# Patient Record
Sex: Female | Born: 1935 | Race: White | Hispanic: No
Health system: Southern US, Community
[De-identification: ages and names within clinical notes are randomized; demographics above are authoritative.]

---

## 2001-11-24 ENCOUNTER — Encounter: Admission: RE | Admit: 2001-11-24 | Discharge: 2001-11-24 | Payer: Self-pay | Admitting: *Deleted

## 2001-11-24 ENCOUNTER — Encounter: Payer: Self-pay | Admitting: *Deleted

## 2001-12-15 ENCOUNTER — Ambulatory Visit (HOSPITAL_COMMUNITY): Admission: RE | Admit: 2001-12-15 | Discharge: 2001-12-15 | Payer: Self-pay | Admitting: Neurology

## 2001-12-15 ENCOUNTER — Encounter: Payer: Self-pay | Admitting: Neurology

## 2003-10-16 ENCOUNTER — Other Ambulatory Visit: Payer: Self-pay

## 2004-07-29 ENCOUNTER — Ambulatory Visit: Payer: Self-pay | Admitting: Family Medicine

## 2005-04-11 ENCOUNTER — Emergency Department: Payer: Self-pay | Admitting: Unknown Physician Specialty

## 2005-04-30 ENCOUNTER — Other Ambulatory Visit: Payer: Self-pay

## 2005-04-30 ENCOUNTER — Emergency Department: Payer: Self-pay | Admitting: Emergency Medicine

## 2005-05-12 ENCOUNTER — Ambulatory Visit: Payer: Self-pay | Admitting: Family Medicine

## 2005-09-07 ENCOUNTER — Ambulatory Visit: Payer: Self-pay | Admitting: Family Medicine

## 2005-09-11 ENCOUNTER — Other Ambulatory Visit: Payer: Self-pay

## 2005-09-11 ENCOUNTER — Emergency Department: Payer: Self-pay | Admitting: Emergency Medicine

## 2005-09-30 ENCOUNTER — Ambulatory Visit: Payer: Self-pay | Admitting: Family Medicine

## 2005-11-17 ENCOUNTER — Ambulatory Visit: Payer: Self-pay | Admitting: Internal Medicine

## 2006-04-06 ENCOUNTER — Ambulatory Visit: Payer: Self-pay | Admitting: Family Medicine

## 2006-09-23 ENCOUNTER — Other Ambulatory Visit: Payer: Self-pay

## 2006-09-24 ENCOUNTER — Inpatient Hospital Stay: Payer: Self-pay | Admitting: Internal Medicine

## 2006-10-20 ENCOUNTER — Other Ambulatory Visit: Payer: Self-pay

## 2006-10-20 ENCOUNTER — Inpatient Hospital Stay: Payer: Self-pay | Admitting: *Deleted

## 2006-10-22 ENCOUNTER — Ambulatory Visit: Payer: Self-pay | Admitting: Internal Medicine

## 2006-11-22 ENCOUNTER — Ambulatory Visit: Payer: Self-pay | Admitting: Internal Medicine

## 2007-06-18 ENCOUNTER — Observation Stay: Payer: Self-pay | Admitting: Internal Medicine

## 2007-06-18 ENCOUNTER — Other Ambulatory Visit: Payer: Self-pay

## 2008-07-09 ENCOUNTER — Inpatient Hospital Stay: Payer: Self-pay | Admitting: Specialist

## 2008-10-01 ENCOUNTER — Inpatient Hospital Stay: Payer: Self-pay | Admitting: Internal Medicine

## 2009-11-20 ENCOUNTER — Ambulatory Visit: Payer: Self-pay | Admitting: Internal Medicine

## 2010-05-16 ENCOUNTER — Inpatient Hospital Stay: Payer: Self-pay | Admitting: Internal Medicine

## 2010-08-22 ENCOUNTER — Inpatient Hospital Stay: Payer: Self-pay | Admitting: Internal Medicine

## 2011-07-24 ENCOUNTER — Ambulatory Visit: Payer: Self-pay | Admitting: Internal Medicine

## 2011-07-27 ENCOUNTER — Inpatient Hospital Stay: Payer: Self-pay | Admitting: Internal Medicine

## 2011-08-24 ENCOUNTER — Ambulatory Visit: Payer: Self-pay | Admitting: Internal Medicine

## 2012-02-16 ENCOUNTER — Inpatient Hospital Stay: Payer: Self-pay | Admitting: Internal Medicine

## 2012-02-16 LAB — COMPREHENSIVE METABOLIC PANEL
Albumin: 3.3 g/dL — ABNORMAL LOW (ref 3.4–5.0)
Anion Gap: 8 (ref 7–16)
Bilirubin,Total: 0.8 mg/dL (ref 0.2–1.0)
Calcium, Total: 8.8 mg/dL (ref 8.5–10.1)
Chloride: 109 mmol/L — ABNORMAL HIGH (ref 98–107)
Co2: 26 mmol/L (ref 21–32)
Creatinine: 1.2 mg/dL (ref 0.60–1.30)
EGFR (African American): 48 — ABNORMAL LOW
EGFR (Non-African Amer.): 41 — ABNORMAL LOW
Glucose: 147 mg/dL — ABNORMAL HIGH (ref 65–99)
Osmolality: 290 (ref 275–301)
Potassium: 3.6 mmol/L (ref 3.5–5.1)
SGOT(AST): 37 U/L (ref 15–37)
SGPT (ALT): 20 U/L
Sodium: 143 mmol/L (ref 136–145)

## 2012-02-16 LAB — TSH: Thyroid Stimulating Horm: 0.803 u[IU]/mL

## 2012-02-16 LAB — CK TOTAL AND CKMB (NOT AT ARMC)
CK, Total: 80 U/L (ref 21–215)
CK, Total: 115 U/L (ref 21–215)
CK-MB: 0.6 ng/mL (ref 0.5–3.6)
CK-MB: 0.6 ng/mL (ref 0.5–3.6)
CK-MB: 0.9 ng/mL (ref 0.5–3.6)

## 2012-02-16 LAB — URINALYSIS, COMPLETE
Bilirubin,UR: NEGATIVE
Ketone: NEGATIVE
Leukocyte Esterase: NEGATIVE
Nitrite: NEGATIVE
Squamous Epithelial: NONE SEEN
WBC UR: 1 /HPF (ref 0–5)

## 2012-02-16 LAB — CBC
HCT: 26.4 % — ABNORMAL LOW (ref 35.0–47.0)
MCH: 27.1 pg (ref 26.0–34.0)
MCV: 84 fL (ref 80–100)
Platelet: 77 10*3/uL — ABNORMAL LOW (ref 150–440)
RBC: 3.15 10*6/uL — ABNORMAL LOW (ref 3.80–5.20)
RDW: 16.5 % — ABNORMAL HIGH (ref 11.5–14.5)

## 2012-02-16 LAB — FOLATE: Folic Acid: 6.1 ng/mL (ref 3.1–100.0)

## 2012-02-16 LAB — PROTIME-INR: Prothrombin Time: 15.6 secs — ABNORMAL HIGH (ref 11.5–14.7)

## 2012-02-16 LAB — TROPONIN I: Troponin-I: 0.14 ng/mL — ABNORMAL HIGH

## 2012-02-16 LAB — PRO B NATRIURETIC PEPTIDE: B-Type Natriuretic Peptide: 1403 pg/mL — ABNORMAL HIGH (ref 0–450)

## 2012-02-17 LAB — BASIC METABOLIC PANEL
Anion Gap: 7 (ref 7–16)
BUN: 25 mg/dL — ABNORMAL HIGH (ref 7–18)
Calcium, Total: 8.2 mg/dL — ABNORMAL LOW (ref 8.5–10.1)
Chloride: 106 mmol/L (ref 98–107)
Co2: 25 mmol/L (ref 21–32)
Creatinine: 1.22 mg/dL (ref 0.60–1.30)
EGFR (African American): 47 — ABNORMAL LOW
EGFR (Non-African Amer.): 41 — ABNORMAL LOW
Glucose: 97 mg/dL (ref 65–99)
Osmolality: 280 (ref 275–301)
Sodium: 138 mmol/L (ref 136–145)

## 2012-02-17 LAB — CBC WITH DIFFERENTIAL/PLATELET
Basophil #: 0 10*3/uL (ref 0.0–0.1)
Basophil %: 0.1 %
Eosinophil #: 0 10*3/uL (ref 0.0–0.7)
HGB: 7.3 g/dL — ABNORMAL LOW (ref 12.0–16.0)
Lymphocyte #: 0.6 10*3/uL — ABNORMAL LOW (ref 1.0–3.6)
Monocyte #: 0.5 x10 3/mm (ref 0.2–0.9)
Neutrophil %: 84.4 %
RBC: 2.69 10*6/uL — ABNORMAL LOW (ref 3.80–5.20)
RDW: 16.6 % — ABNORMAL HIGH (ref 11.5–14.5)
WBC: 7 10*3/uL (ref 3.6–11.0)

## 2012-02-17 LAB — MAGNESIUM: Magnesium: 1.9 mg/dL

## 2012-02-17 LAB — RETICULOCYTES: Absolute Retic Count: 0.03 10*6/uL (ref 0.024–0.084)

## 2012-02-18 LAB — BASIC METABOLIC PANEL
BUN: 24 mg/dL — ABNORMAL HIGH (ref 7–18)
Calcium, Total: 7.9 mg/dL — ABNORMAL LOW (ref 8.5–10.1)
Co2: 24 mmol/L (ref 21–32)
EGFR (African American): >60
EGFR (Non-African Amer.): 54 — ABNORMAL LOW
Glucose: 94 mg/dL (ref 65–99)
Potassium: 3.7 mmol/L (ref 3.5–5.1)
Sodium: 140 mmol/L (ref 136–145)

## 2012-02-18 LAB — CBC WITH DIFFERENTIAL/PLATELET
Basophil #: 0 10*3/uL (ref 0.0–0.1)
Basophil %: 0.4 %
Eosinophil %: 3.8 %
HCT: 24.2 % — ABNORMAL LOW (ref 35.0–47.0)
HGB: 7.9 g/dL — ABNORMAL LOW (ref 12.0–16.0)
MCHC: 32.7 g/dL (ref 32.0–36.0)
MCV: 82 fL (ref 80–100)
Monocyte #: 0.4 x10 3/mm (ref 0.2–0.9)
Neutrophil #: 4.2 10*3/uL (ref 1.4–6.5)
Neutrophil %: 77.3 %
RBC: 2.94 10*6/uL — ABNORMAL LOW (ref 3.80–5.20)
WBC: 5.5 10*3/uL (ref 3.6–11.0)

## 2012-02-19 LAB — CBC WITH DIFFERENTIAL/PLATELET
Basophil #: 0.1 10*3/uL (ref 0.0–0.1)
Basophil %: 1.5 %
Eosinophil #: 0.3 10*3/uL (ref 0.0–0.7)
MCH: 26.7 pg (ref 26.0–34.0)
Monocyte #: 0.4 x10 3/mm (ref 0.2–0.9)
Monocyte %: 9 %
Neutrophil #: 3.1 10*3/uL (ref 1.4–6.5)
Neutrophil %: 64.8 %
Platelet: 81 10*3/uL — ABNORMAL LOW (ref 150–440)
RBC: 2.89 10*6/uL — ABNORMAL LOW (ref 3.80–5.20)
RDW: 16.4 % — ABNORMAL HIGH (ref 11.5–14.5)

## 2012-02-19 LAB — BASIC METABOLIC PANEL
Anion Gap: 9 (ref 7–16)
BUN: 18 mg/dL (ref 7–18)
Calcium, Total: 7.9 mg/dL — ABNORMAL LOW (ref 8.5–10.1)
Chloride: 107 mmol/L (ref 98–107)
Co2: 26 mmol/L (ref 21–32)
EGFR (African American): >60
EGFR (Non-African Amer.): 56 — ABNORMAL LOW
Osmolality: 284 (ref 275–301)
Sodium: 142 mmol/L (ref 136–145)

## 2012-02-20 LAB — BASIC METABOLIC PANEL
Anion Gap: 5 — ABNORMAL LOW (ref 7–16)
BUN: 19 mg/dL — ABNORMAL HIGH (ref 7–18)
Creatinine: 1.16 mg/dL (ref 0.60–1.30)
EGFR (African American): 50 — ABNORMAL LOW
EGFR (Non-African Amer.): 43 — ABNORMAL LOW
Glucose: 152 mg/dL — ABNORMAL HIGH (ref 65–99)
Osmolality: 292 (ref 275–301)
Potassium: 4.1 mmol/L (ref 3.5–5.1)
Sodium: 144 mmol/L (ref 136–145)

## 2012-02-21 ENCOUNTER — Ambulatory Visit: Payer: Self-pay | Admitting: Internal Medicine

## 2012-03-04 ENCOUNTER — Inpatient Hospital Stay: Payer: Self-pay | Admitting: Internal Medicine

## 2012-03-04 LAB — URINALYSIS, COMPLETE
Bacteria: NONE SEEN
Bilirubin,UR: NEGATIVE
Glucose,UR: NEGATIVE mg/dL (ref 0–75)
Ketone: NEGATIVE
Leukocyte Esterase: NEGATIVE
Nitrite: NEGATIVE
Ph: 7 (ref 4.5–8.0)
Protein: NEGATIVE
RBC,UR: 22 /HPF (ref 0–5)
Specific Gravity: 1.006 (ref 1.003–1.030)
Squamous Epithelial: NONE SEEN
WBC UR: 1 /HPF (ref 0–5)

## 2012-03-04 LAB — APTT: Activated PTT: 29.6 secs (ref 23.6–35.9)

## 2012-03-04 LAB — CBC WITH DIFFERENTIAL/PLATELET
Basophil #: 0 10*3/uL (ref 0.0–0.1)
Basophil #: 0 10*3/uL (ref 0.0–0.1)
Basophil %: 0.9 %
Basophil %: 0.8 %
Eosinophil #: 0 10*3/uL (ref 0.0–0.7)
Eosinophil #: 0 10*3/uL (ref 0.0–0.7)
Eosinophil %: 0.3 %
Eosinophil %: 0.7 %
HCT: 26.2 % — ABNORMAL LOW (ref 35.0–47.0)
HCT: 24 % — ABNORMAL LOW (ref 35.0–47.0)
HGB: 8.6 g/dL — ABNORMAL LOW (ref 12.0–16.0)
HGB: 7.8 g/dL — ABNORMAL LOW (ref 12.0–16.0)
Lymphocyte #: 0.6 10*3/uL — ABNORMAL LOW (ref 1.0–3.6)
Lymphocyte #: 0.8 10*3/uL — ABNORMAL LOW (ref 1.0–3.6)
Lymphocyte %: 11.6 %
Lymphocyte %: 17.7 %
MCH: 27.2 pg (ref 26.0–34.0)
MCH: 26.7 pg (ref 26.0–34.0)
MCHC: 32.8 g/dL (ref 32.0–36.0)
MCHC: 32.5 g/dL (ref 32.0–36.0)
MCV: 83 fL (ref 80–100)
MCV: 82 fL (ref 80–100)
Monocyte #: 0.3 x10 3/mm (ref 0.2–0.9)
Monocyte #: 0.4 x10 3/mm (ref 0.2–0.9)
Monocyte %: 5.5 %
Monocyte %: 9.5 %
Neutrophil #: 4.3 10*3/uL (ref 1.4–6.5)
Neutrophil #: 3.3 10*3/uL (ref 1.4–6.5)
Neutrophil %: 81.7 %
Neutrophil %: 71.3 %
Platelet: 174 10*3/uL (ref 150–440)
Platelet: 132 10*3/uL — ABNORMAL LOW (ref 150–440)
RBC: 3.17 10*6/uL — ABNORMAL LOW (ref 3.80–5.20)
RBC: 2.91 10*6/uL — ABNORMAL LOW (ref 3.80–5.20)
RDW: 18.4 % — ABNORMAL HIGH (ref 11.5–14.5)
RDW: 18.5 % — ABNORMAL HIGH (ref 11.5–14.5)
WBC: 5.2 10*3/uL (ref 3.6–11.0)
WBC: 4.6 10*3/uL (ref 3.6–11.0)

## 2012-03-04 LAB — COMPREHENSIVE METABOLIC PANEL
Albumin: 3.2 g/dL — ABNORMAL LOW (ref 3.4–5.0)
Alkaline Phosphatase: 80 U/L (ref 50–136)
Anion Gap: 8 (ref 7–16)
BUN: 25 mg/dL — ABNORMAL HIGH (ref 7–18)
Bilirubin,Total: 0.9 mg/dL (ref 0.2–1.0)
Calcium, Total: 8.7 mg/dL (ref 8.5–10.1)
Chloride: 105 mmol/L (ref 98–107)
Co2: 30 mmol/L (ref 21–32)
Creatinine: 1.16 mg/dL (ref 0.60–1.30)
EGFR (African American): 50 — ABNORMAL LOW
EGFR (Non-African Amer.): 43 — ABNORMAL LOW
Glucose: 128 mg/dL — ABNORMAL HIGH (ref 65–99)
Osmolality: 291 (ref 275–301)
Potassium: 4.5 mmol/L (ref 3.5–5.1)
SGOT(AST): 54 U/L — ABNORMAL HIGH (ref 15–37)
SGPT (ALT): 25 U/L
Sodium: 143 mmol/L (ref 136–145)
Total Protein: 7.2 g/dL (ref 6.4–8.2)

## 2012-03-04 LAB — PROTIME-INR
INR: 1
Prothrombin Time: 13.4 secs (ref 11.5–14.7)

## 2012-03-04 LAB — PRO B NATRIURETIC PEPTIDE: B-Type Natriuretic Peptide: 1077 pg/mL — ABNORMAL HIGH (ref 0–450)

## 2012-03-04 LAB — LIPASE, BLOOD: Lipase: 113 U/L (ref 73–393)

## 2012-03-04 LAB — TROPONIN I: Troponin-I: <0.02 ng/mL

## 2012-03-05 LAB — CBC WITH DIFFERENTIAL/PLATELET
Basophil #: 0 10*3/uL (ref 0.0–0.1)
Basophil %: 0.4 %
Eosinophil #: 0.1 10*3/uL (ref 0.0–0.7)
Eosinophil %: 2.1 %
HCT: 19.8 % — ABNORMAL LOW (ref 35.0–47.0)
HGB: 6.5 g/dL — ABNORMAL LOW (ref 12.0–16.0)
Lymphocyte %: 19.5 %
MCH: 27.7 pg (ref 26.0–34.0)
Monocyte #: 0.4 x10 3/mm (ref 0.2–0.9)
Neutrophil %: 66.4 %
Platelet: 92 10*3/uL — ABNORMAL LOW (ref 150–440)

## 2012-03-05 LAB — BASIC METABOLIC PANEL
Anion Gap: 10 (ref 7–16)
Calcium, Total: 6.7 mg/dL — CL (ref 8.5–10.1)
Chloride: 115 mmol/L — ABNORMAL HIGH (ref 98–107)
Co2: 24 mmol/L (ref 21–32)
Creatinine: 0.94 mg/dL (ref 0.60–1.30)
Glucose: 72 mg/dL (ref 65–99)
Potassium: 2.9 mmol/L — ABNORMAL LOW (ref 3.5–5.1)
Sodium: 149 mmol/L — ABNORMAL HIGH (ref 136–145)

## 2012-03-05 LAB — HEMOGLOBIN: HGB: 9.7 g/dL — ABNORMAL LOW (ref 12.0–16.0)

## 2012-03-06 LAB — CBC WITH DIFFERENTIAL/PLATELET
Basophil %: 0.6 %
Eosinophil %: 3.1 %
HCT: 27.2 % — ABNORMAL LOW (ref 35.0–47.0)
Lymphocyte #: 0.7 10*3/uL — ABNORMAL LOW (ref 1.0–3.6)
Lymphocyte %: 16.5 %
MCH: 27.7 pg (ref 26.0–34.0)
MCV: 84 fL (ref 80–100)
Monocyte #: 0.5 x10 3/mm (ref 0.2–0.9)
Neutrophil #: 2.8 10*3/uL (ref 1.4–6.5)
Platelet: 108 10*3/uL — ABNORMAL LOW (ref 150–440)
RBC: 3.23 10*6/uL — ABNORMAL LOW (ref 3.80–5.20)
RDW: 17.8 % — ABNORMAL HIGH (ref 11.5–14.5)

## 2012-03-06 LAB — BASIC METABOLIC PANEL
Anion Gap: 7 (ref 7–16)
BUN: 23 mg/dL — ABNORMAL HIGH (ref 7–18)
Chloride: 113 mmol/L — ABNORMAL HIGH (ref 98–107)
Co2: 26 mmol/L (ref 21–32)
EGFR (Non-African Amer.): 54 — ABNORMAL LOW
Glucose: 95 mg/dL (ref 65–99)
Osmolality: 294 (ref 275–301)
Potassium: 3.9 mmol/L (ref 3.5–5.1)

## 2012-03-07 LAB — CBC WITH DIFFERENTIAL/PLATELET
Basophil #: 0 10*3/uL (ref 0.0–0.1)
Basophil %: 0.6 %
Eosinophil #: 0.2 10*3/uL (ref 0.0–0.7)
Lymphocyte #: 0.9 10*3/uL — ABNORMAL LOW (ref 1.0–3.6)
Lymphocyte %: 20.2 %
MCH: 27.3 pg (ref 26.0–34.0)
MCHC: 32.2 g/dL (ref 32.0–36.0)
MCV: 85 fL (ref 80–100)
Neutrophil #: 2.8 10*3/uL (ref 1.4–6.5)
Platelet: 92 10*3/uL — ABNORMAL LOW (ref 150–440)
RBC: 3.15 10*6/uL — ABNORMAL LOW (ref 3.80–5.20)
RDW: 17.8 % — ABNORMAL HIGH (ref 11.5–14.5)

## 2012-03-07 LAB — POTASSIUM: Potassium: 3.5 mmol/L (ref 3.5–5.1)

## 2012-03-08 LAB — BASIC METABOLIC PANEL
BUN: 14 mg/dL (ref 7–18)
Chloride: 110 mmol/L — ABNORMAL HIGH (ref 98–107)
Co2: 23 mmol/L (ref 21–32)
Creatinine: 0.85 mg/dL (ref 0.60–1.30)
EGFR (Non-African Amer.): >60
Glucose: 97 mg/dL (ref 65–99)
Osmolality: 284 (ref 275–301)
Sodium: 142 mmol/L (ref 136–145)

## 2012-03-08 LAB — CBC WITH DIFFERENTIAL/PLATELET
Eosinophil #: 0.2 10*3/uL (ref 0.0–0.7)
Lymphocyte #: 1 10*3/uL (ref 1.0–3.6)
Lymphocyte %: 22.1 %
MCHC: 31.8 g/dL — ABNORMAL LOW (ref 32.0–36.0)
MCV: 86 fL (ref 80–100)
Monocyte %: 12.7 %
Neutrophil #: 2.7 10*3/uL (ref 1.4–6.5)
Neutrophil %: 60.9 %
Platelet: 83 10*3/uL — ABNORMAL LOW (ref 150–440)
RBC: 3 10*6/uL — ABNORMAL LOW (ref 3.80–5.20)
RDW: 18.2 % — ABNORMAL HIGH (ref 11.5–14.5)
WBC: 4.5 10*3/uL (ref 3.6–11.0)

## 2012-03-08 LAB — HEMOGLOBIN: HGB: 8.8 g/dL — ABNORMAL LOW (ref 12.0–16.0)

## 2012-03-09 LAB — BASIC METABOLIC PANEL
Anion Gap: 6 — ABNORMAL LOW (ref 7–16)
BUN: 11 mg/dL (ref 7–18)
Chloride: 110 mmol/L — ABNORMAL HIGH (ref 98–107)
Creatinine: 0.88 mg/dL (ref 0.60–1.30)
Glucose: 89 mg/dL (ref 65–99)
Potassium: 4.2 mmol/L (ref 3.5–5.1)
Sodium: 142 mmol/L (ref 136–145)

## 2012-03-09 LAB — CBC WITH DIFFERENTIAL/PLATELET
Basophil %: 0.8 %
Eosinophil #: 0.3 10*3/uL (ref 0.0–0.7)
HCT: 25.8 % — ABNORMAL LOW (ref 35.0–47.0)
Lymphocyte #: 0.8 10*3/uL — ABNORMAL LOW (ref 1.0–3.6)
MCHC: 31.8 g/dL — ABNORMAL LOW (ref 32.0–36.0)
MCV: 87 fL (ref 80–100)
Monocyte %: 12.7 %
Neutrophil #: 2.2 10*3/uL (ref 1.4–6.5)
Platelet: 82 10*3/uL — ABNORMAL LOW (ref 150–440)
RDW: 17.8 % — ABNORMAL HIGH (ref 11.5–14.5)
WBC: 3.9 10*3/uL (ref 3.6–11.0)

## 2012-03-10 LAB — CBC WITH DIFFERENTIAL/PLATELET
Basophil #: 0 10*3/uL (ref 0.0–0.1)
Basophil %: 0.6 %
Eosinophil %: 7.4 %
HCT: 25.3 % — ABNORMAL LOW (ref 35.0–47.0)
HGB: 8.2 g/dL — ABNORMAL LOW (ref 12.0–16.0)
Lymphocyte %: 20.2 %
MCH: 27.7 pg (ref 26.0–34.0)
MCV: 86 fL (ref 80–100)
Monocyte #: 0.4 x10 3/mm (ref 0.2–0.9)
Neutrophil #: 1.9 10*3/uL (ref 1.4–6.5)
Neutrophil %: 59 %
RBC: 2.94 10*6/uL — ABNORMAL LOW (ref 3.80–5.20)
RDW: 18.3 % — ABNORMAL HIGH (ref 11.5–14.5)

## 2012-03-23 ENCOUNTER — Ambulatory Visit: Payer: Self-pay | Admitting: Internal Medicine

## 2012-04-28 ENCOUNTER — Inpatient Hospital Stay: Payer: Self-pay | Admitting: Internal Medicine

## 2012-04-28 LAB — CBC
HCT: 30.5 % — ABNORMAL LOW (ref 35.0–47.0)
MCH: 29.1 pg (ref 26.0–34.0)
MCV: 87 fL (ref 80–100)
RBC: 3.52 10*6/uL — ABNORMAL LOW (ref 3.80–5.20)

## 2012-04-28 LAB — DRUG SCREEN, URINE

## 2012-04-28 LAB — COMPREHENSIVE METABOLIC PANEL
Alkaline Phosphatase: 96 U/L (ref 50–136)
Anion Gap: 8 (ref 7–16)
BUN: 24 mg/dL — ABNORMAL HIGH (ref 7–18)
Chloride: 105 mmol/L (ref 98–107)
Co2: 31 mmol/L (ref 21–32)
EGFR (African American): 51 — ABNORMAL LOW
EGFR (Non-African Amer.): 44 — ABNORMAL LOW
Potassium: 3.7 mmol/L (ref 3.5–5.1)
SGOT(AST): 43 U/L — ABNORMAL HIGH (ref 15–37)
SGPT (ALT): 30 U/L (ref 12–78)
Total Protein: 6.5 g/dL (ref 6.4–8.2)

## 2012-04-28 LAB — URINALYSIS, COMPLETE
Blood: NEGATIVE
Ketone: NEGATIVE
Ph: 7 (ref 4.5–8.0)
Protein: NEGATIVE
Specific Gravity: 1.005 (ref 1.003–1.030)
Squamous Epithelial: 1
WBC UR: 1 /HPF (ref 0–5)

## 2012-04-28 LAB — ACETAMINOPHEN LEVEL: Acetaminophen: <2 ug/mL

## 2012-04-28 LAB — SALICYLATE LEVEL: Salicylates, Serum: <1.7 mg/dL

## 2012-04-28 LAB — TROPONIN I: Troponin-I: 0.03 ng/mL

## 2012-04-28 LAB — ETHANOL
Ethanol %: <0.003 % (ref 0.000–0.080)
Ethanol: <3 mg/dL

## 2012-04-28 LAB — CK TOTAL AND CKMB (NOT AT ARMC): CK-MB: 0.5 ng/mL (ref 0.5–3.6)

## 2012-04-29 LAB — CBC WITH DIFFERENTIAL/PLATELET
Basophil %: 1.1 %
Eosinophil #: 0.2 10*3/uL (ref 0.0–0.7)
Eosinophil %: 4.8 %
HCT: 27.3 % — ABNORMAL LOW (ref 35.0–47.0)
HGB: 9.1 g/dL — ABNORMAL LOW (ref 12.0–16.0)
Lymphocyte %: 15.2 %
MCV: 86 fL (ref 80–100)
Monocyte %: 14.6 %
Neutrophil #: 2.5 10*3/uL (ref 1.4–6.5)
Neutrophil %: 64.3 %
RBC: 3.18 10*6/uL — ABNORMAL LOW (ref 3.80–5.20)
WBC: 3.9 10*3/uL (ref 3.6–11.0)

## 2012-04-29 LAB — BASIC METABOLIC PANEL
BUN: 24 mg/dL — ABNORMAL HIGH (ref 7–18)
Calcium, Total: 8.5 mg/dL (ref 8.5–10.1)
Chloride: 106 mmol/L (ref 98–107)
EGFR (Non-African Amer.): 48 — ABNORMAL LOW
Glucose: 89 mg/dL (ref 65–99)
Osmolality: 287 (ref 275–301)

## 2012-04-30 LAB — BASIC METABOLIC PANEL
Calcium, Total: 8 mg/dL — ABNORMAL LOW (ref 8.5–10.1)
Chloride: 108 mmol/L — ABNORMAL HIGH (ref 98–107)
Co2: 30 mmol/L (ref 21–32)
Creatinine: 1.2 mg/dL (ref 0.60–1.30)
EGFR (Non-African Amer.): 41 — ABNORMAL LOW
Glucose: 104 mg/dL — ABNORMAL HIGH (ref 65–99)
Potassium: 3.6 mmol/L (ref 3.5–5.1)
Sodium: 145 mmol/L (ref 136–145)

## 2012-04-30 LAB — CBC WITH DIFFERENTIAL/PLATELET
Basophil #: 0 10*3/uL (ref 0.0–0.1)
Eosinophil #: 0.2 10*3/uL (ref 0.0–0.7)
HCT: 26.3 % — ABNORMAL LOW (ref 35.0–47.0)
HGB: 8.6 g/dL — ABNORMAL LOW (ref 12.0–16.0)
Lymphocyte #: 0.9 10*3/uL — ABNORMAL LOW (ref 1.0–3.6)
MCHC: 32.7 g/dL (ref 32.0–36.0)
MCV: 86 fL (ref 80–100)
Monocyte #: 0.7 x10 3/mm (ref 0.2–0.9)
Monocyte %: 14.8 %
Neutrophil #: 2.6 10*3/uL (ref 1.4–6.5)
Neutrophil %: 57.6 %
Platelet: 106 10*3/uL — ABNORMAL LOW (ref 150–440)
RDW: 16.1 % — ABNORMAL HIGH (ref 11.5–14.5)
WBC: 4.4 10*3/uL (ref 3.6–11.0)

## 2012-04-30 LAB — URINE CULTURE

## 2012-05-01 LAB — CBC WITH DIFFERENTIAL/PLATELET
Basophil %: 0.7 %
Eosinophil #: 0.2 10*3/uL (ref 0.0–0.7)
Eosinophil %: 4.4 %
HCT: 27.1 % — ABNORMAL LOW (ref 35.0–47.0)
HGB: 8.9 g/dL — ABNORMAL LOW (ref 12.0–16.0)
Lymphocyte %: 19 %
MCH: 28.2 pg (ref 26.0–34.0)
MCHC: 32.9 g/dL (ref 32.0–36.0)
Monocyte #: 0.6 x10 3/mm (ref 0.2–0.9)
Neutrophil #: 3.4 10*3/uL (ref 1.4–6.5)
RBC: 3.16 10*6/uL — ABNORMAL LOW (ref 3.80–5.20)

## 2012-05-01 LAB — BASIC METABOLIC PANEL
Anion Gap: 3 — ABNORMAL LOW (ref 7–16)
BUN: 25 mg/dL — ABNORMAL HIGH (ref 7–18)
Creatinine: 0.99 mg/dL (ref 0.60–1.30)
EGFR (African American): >60
EGFR (Non-African Amer.): 52 — ABNORMAL LOW
Glucose: 100 mg/dL — ABNORMAL HIGH (ref 65–99)
Osmolality: 288 (ref 275–301)

## 2012-05-04 LAB — CULTURE, BLOOD (SINGLE)

## 2012-06-04 LAB — CBC WITH DIFFERENTIAL/PLATELET
Basophil %: 0.8 %
Eosinophil #: 0.1 10*3/uL (ref 0.0–0.7)
Eosinophil %: 2.7 %
HCT: 29.7 % — ABNORMAL LOW (ref 35.0–47.0)
Lymphocyte #: 0.9 10*3/uL — ABNORMAL LOW (ref 1.0–3.6)
MCHC: 33 g/dL (ref 32.0–36.0)
MCV: 86 fL (ref 80–100)
Monocyte %: 8.3 %
Neutrophil #: 2.8 10*3/uL (ref 1.4–6.5)
Neutrophil %: 66.8 %
RBC: 3.47 10*6/uL — ABNORMAL LOW (ref 3.80–5.20)
WBC: 4.2 10*3/uL (ref 3.6–11.0)

## 2012-06-04 LAB — COMPREHENSIVE METABOLIC PANEL
Albumin: 3 g/dL — ABNORMAL LOW (ref 3.4–5.0)
Alkaline Phosphatase: 81 U/L (ref 50–136)
Anion Gap: 9 (ref 7–16)
Calcium, Total: 8.7 mg/dL (ref 8.5–10.1)
Glucose: 91 mg/dL (ref 65–99)
SGOT(AST): 49 U/L — ABNORMAL HIGH (ref 15–37)
Total Protein: 5.9 g/dL — ABNORMAL LOW (ref 6.4–8.2)

## 2012-06-04 LAB — URINALYSIS, COMPLETE
Blood: NEGATIVE
Glucose,UR: NEGATIVE mg/dL (ref 0–75)
Leukocyte Esterase: NEGATIVE
Nitrite: NEGATIVE
Ph: 6 (ref 4.5–8.0)
Protein: NEGATIVE
Specific Gravity: 1.021 (ref 1.003–1.030)
WBC UR: 2 /HPF (ref 0–5)

## 2012-06-04 LAB — CK TOTAL AND CKMB (NOT AT ARMC)
CK, Total: 92 U/L (ref 21–215)
CK-MB: 1.1 ng/mL (ref 0.5–3.6)

## 2012-06-04 LAB — AMMONIA: Ammonia, Plasma: 29 mcmol/L (ref 11–32)

## 2012-06-05 ENCOUNTER — Inpatient Hospital Stay: Payer: Self-pay | Admitting: Internal Medicine

## 2012-06-05 LAB — BASIC METABOLIC PANEL
Anion Gap: 9 (ref 7–16)
BUN: 19 mg/dL — ABNORMAL HIGH (ref 7–18)
Chloride: 116 mmol/L — ABNORMAL HIGH (ref 98–107)
Co2: 24 mmol/L (ref 21–32)
Creatinine: 0.84 mg/dL (ref 0.60–1.30)
EGFR (Non-African Amer.): >60
Glucose: 89 mg/dL (ref 65–99)
Osmolality: 298 (ref 275–301)
Potassium: 3.6 mmol/L (ref 3.5–5.1)
Sodium: 149 mmol/L — ABNORMAL HIGH (ref 136–145)

## 2012-06-05 LAB — SEDIMENTATION RATE: Erythrocyte Sed Rate: 13 mm/hr (ref 0–30)

## 2012-06-06 LAB — COMPREHENSIVE METABOLIC PANEL
Albumin: 2.6 g/dL — ABNORMAL LOW (ref 3.4–5.0)
BUN: 15 mg/dL (ref 7–18)
Bilirubin,Total: 0.8 mg/dL (ref 0.2–1.0)
Co2: 24 mmol/L (ref 21–32)
Glucose: 91 mg/dL (ref 65–99)
Osmolality: 291 (ref 275–301)
SGOT(AST): 42 U/L — ABNORMAL HIGH (ref 15–37)
SGPT (ALT): 23 U/L (ref 12–78)
Sodium: 146 mmol/L — ABNORMAL HIGH (ref 136–145)
Total Protein: 5 g/dL — ABNORMAL LOW (ref 6.4–8.2)

## 2012-06-07 LAB — BASIC METABOLIC PANEL
Anion Gap: 8 (ref 7–16)
BUN: 11 mg/dL (ref 7–18)
Calcium, Total: 8.4 mg/dL — ABNORMAL LOW (ref 8.5–10.1)
Chloride: 110 mmol/L — ABNORMAL HIGH (ref 98–107)
Co2: 27 mmol/L (ref 21–32)
Creatinine: 0.8 mg/dL (ref 0.60–1.30)
EGFR (African American): >60
EGFR (Non-African Amer.): >60
Glucose: 102 mg/dL — ABNORMAL HIGH (ref 65–99)
Osmolality: 288 (ref 275–301)
Potassium: 3.8 mmol/L (ref 3.5–5.1)
Sodium: 145 mmol/L (ref 136–145)

## 2012-06-07 LAB — HEMATOCRIT: HCT: 25.7 % — ABNORMAL LOW (ref 35.0–47.0)

## 2012-06-08 ENCOUNTER — Ambulatory Visit: Payer: Self-pay | Admitting: Internal Medicine

## 2012-06-08 LAB — BASIC METABOLIC PANEL
Anion Gap: 9 (ref 7–16)
Calcium, Total: 8.9 mg/dL (ref 8.5–10.1)
Chloride: 113 mmol/L — ABNORMAL HIGH (ref 98–107)
Co2: 22 mmol/L (ref 21–32)
Creatinine: 0.63 mg/dL (ref 0.60–1.30)
EGFR (African American): >60
Osmolality: 285 (ref 275–301)
Sodium: 144 mmol/L (ref 136–145)

## 2012-06-08 LAB — CBC WITH DIFFERENTIAL/PLATELET
Basophil %: 0.9 %
Eosinophil #: 0.2 10*3/uL (ref 0.0–0.7)
Eosinophil %: 3.9 %
Lymphocytes: 16 %
MCH: 28.8 pg (ref 26.0–34.0)
MCHC: 33.5 g/dL (ref 32.0–36.0)
Monocyte #: 0.5 x10 3/mm (ref 0.2–0.9)
Neutrophil %: 73.7 %
Platelet: 102 10*3/uL — ABNORMAL LOW (ref 150–440)
RBC: 3.07 10*6/uL — ABNORMAL LOW (ref 3.80–5.20)
Segmented Neutrophils: 77 %

## 2012-06-08 LAB — LACTATE DEHYDROGENASE: LDH: 336 U/L — ABNORMAL HIGH (ref 81–246)

## 2012-06-23 ENCOUNTER — Ambulatory Visit: Payer: Self-pay | Admitting: Internal Medicine

## 2012-06-23 DEATH — deceased

## 2013-07-28 IMAGING — CT CT HEAD WITHOUT CONTRAST
3 of 4 series · 17 of 30 positions shown, 19 images · non-contrast
Comparison: none

REASON FOR EXAM: altered mental status
COMMENTS:

PROCEDURE:     CT  - CT HEAD WITHOUT CONTRAST  - April 28, 2012  [DATE]
RESULT:     Comparison:  07/26/2011
TECHNIQUE: Multiple axial images from the foramen magnum to the vertex were
obtained without IV contrast.

[Series 2: without · axial · non-contrast · 0.40mm/px · z∈[-156,-56]mm · 5 of 31 slices shown]
[im 6/31  brain]
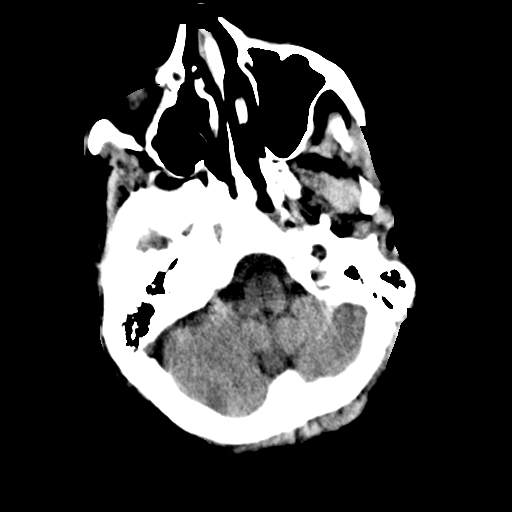
[im 11/31  brain]
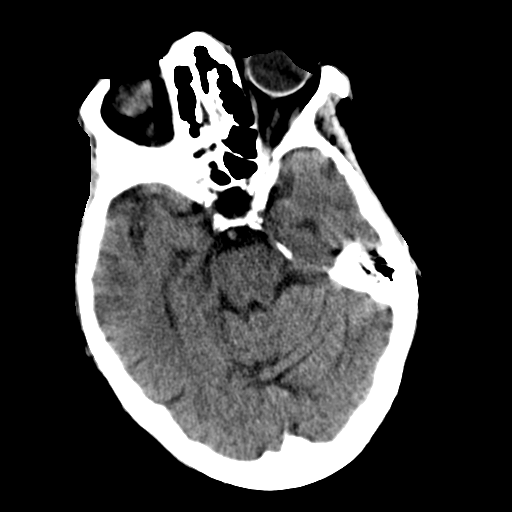
[im 16/31  brain]
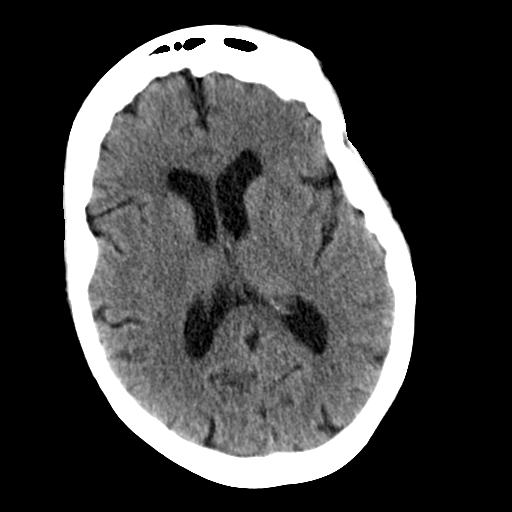
[im 21/31  brain]
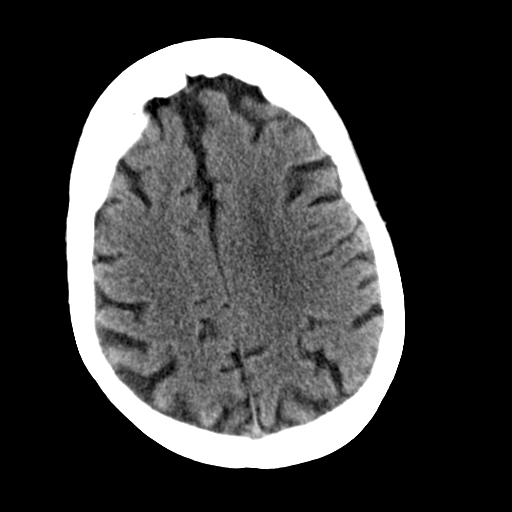
[im 26/31  brain]
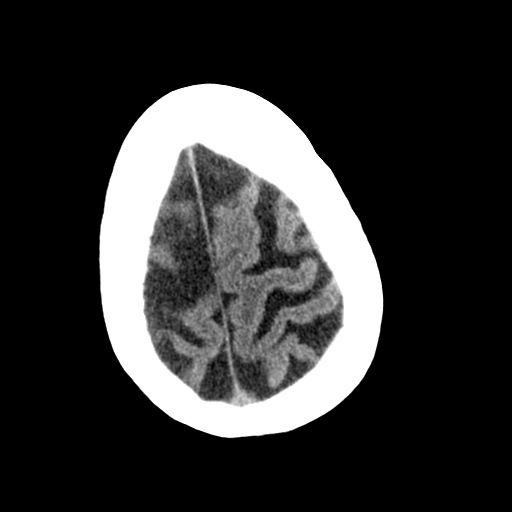

[Series 4: bone recon · axial · 0.40mm/px · z∈[-173,-64]mm · 6 of 32 slices shown]
[im 5/32  bone]
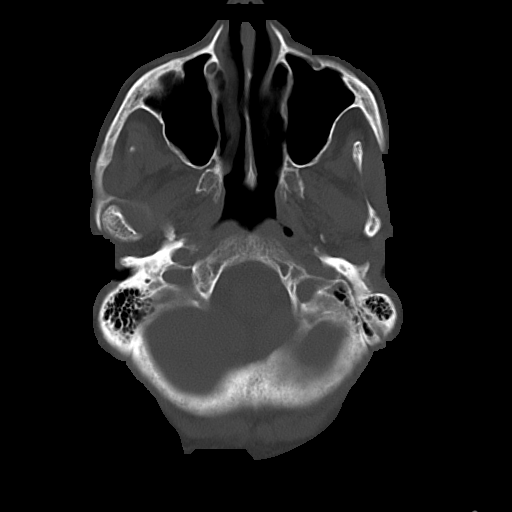
[im 9/32  bone]
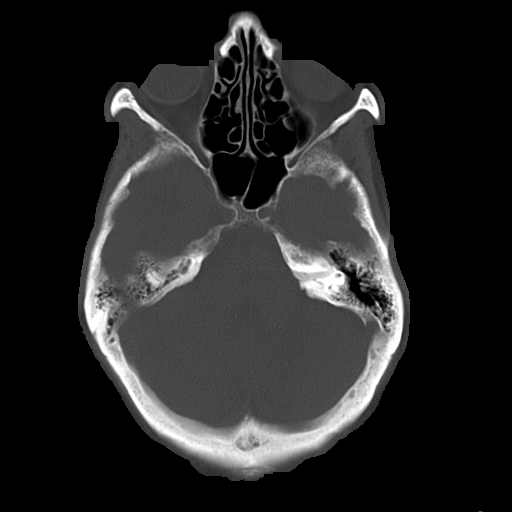
[im 14/32  bone]
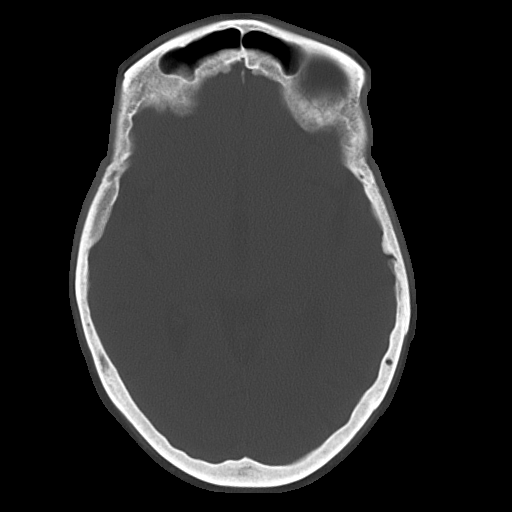
[im 18/32  bone]
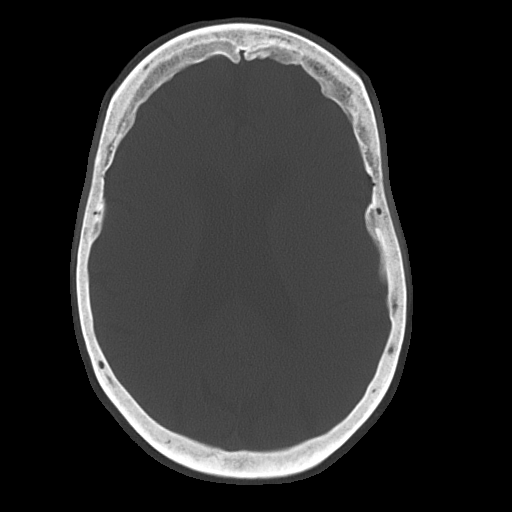
[im 23/32  bone]
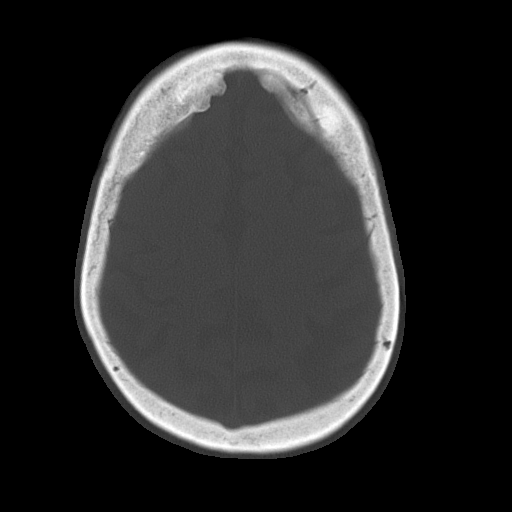
[im 27/32  bone]
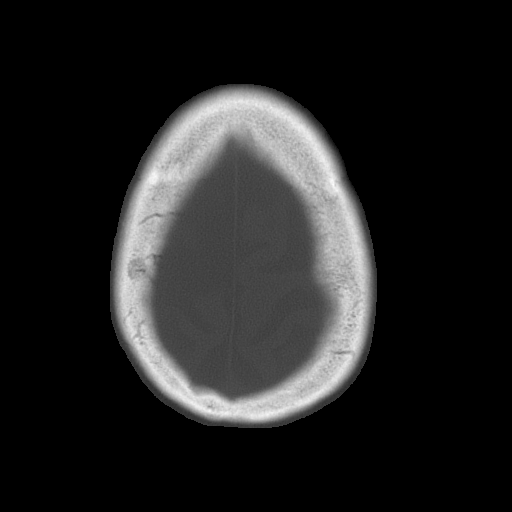

[Series 5: without recon · axial · non-contrast · 0.40mm/px · z∈[-168,-59]mm · 6 of 32 slices shown, 8 images]
[im 5/32  brain]
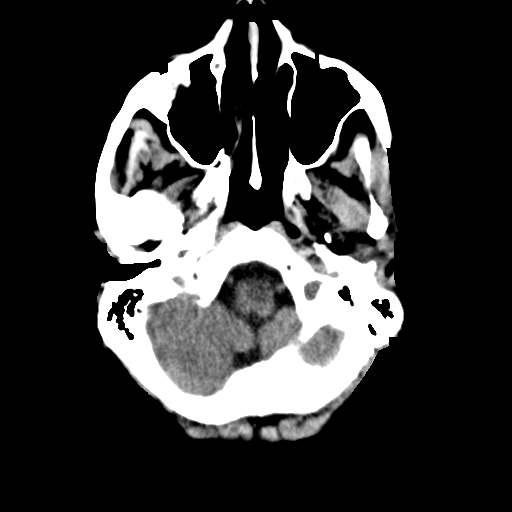
[im 5/32  bone]
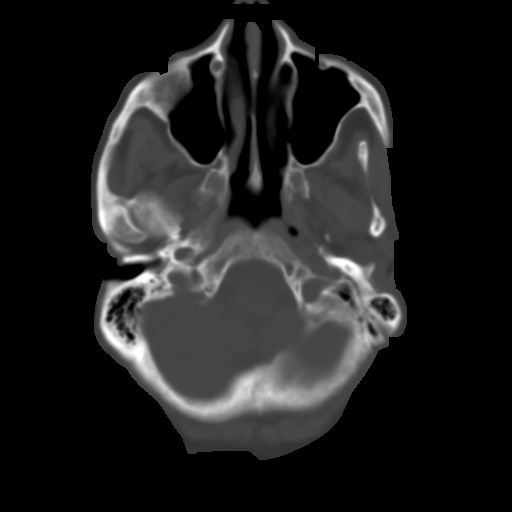
[im 9/32  brain]
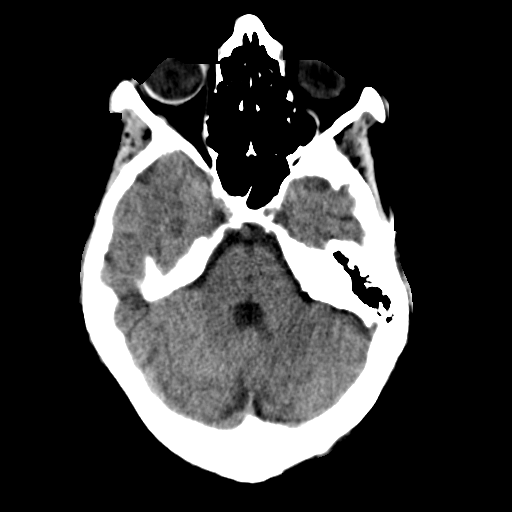
[im 14/32  brain]
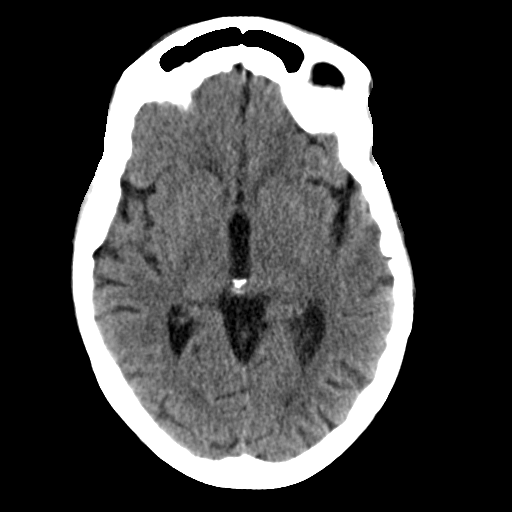
[im 18/32  brain]
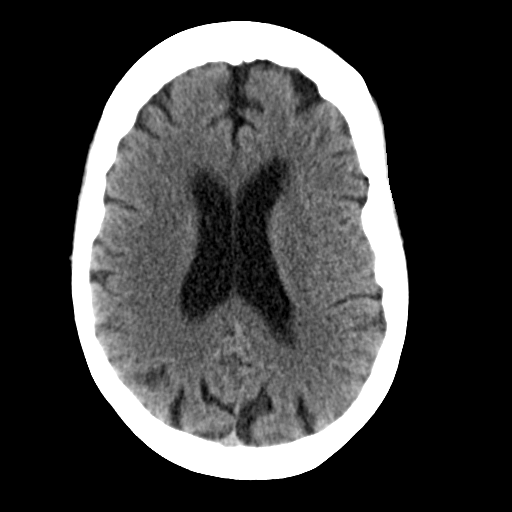
[im 23/32  brain]
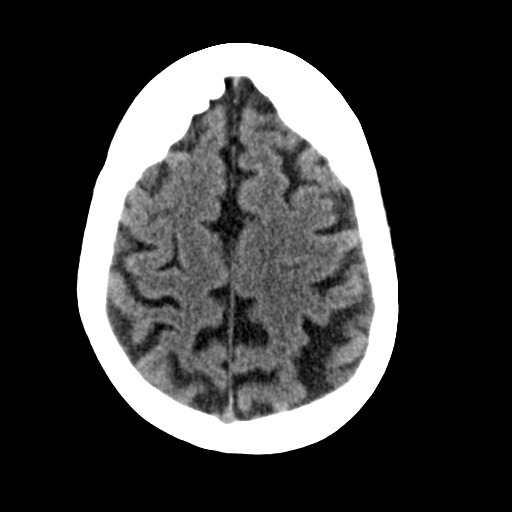
[im 23/32  bone]
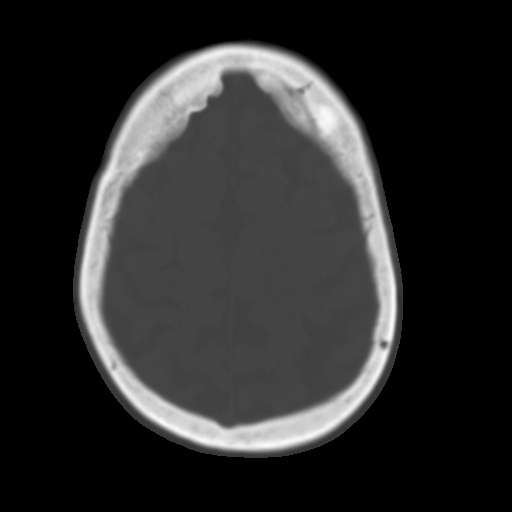
[im 27/32  brain]
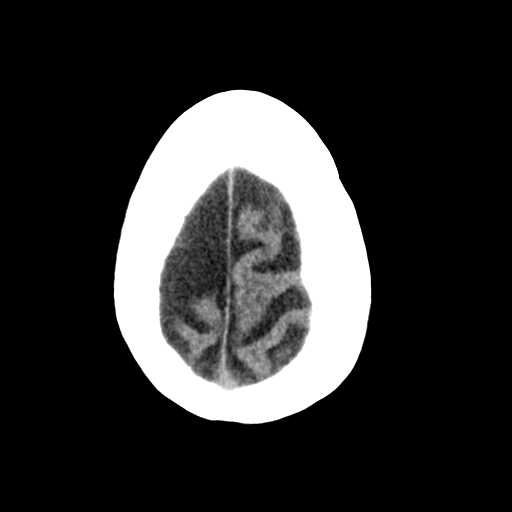

[17 of 30 positions shown; findings below may reference images not displayed]

FINDINGS: There is no evidence of mass effect, midline shift, or extra-axial fluid
collections.  There is no evidence of a space-occupying lesion or
intracranial hemorrhage. There is no evidence of a cortical-based area of
acute infarction. There is generalized cerebral atrophy. There is
periventricular white matter low attenuation likely secondary to
microangiopathy.

The ventricles and sulci are appropriate for the patient's age. The basal
cisterns are patent.

Visualized portions of the orbits are unremarkable. The visualized portions
of the paranasal sinuses and mastoid air cells are unremarkable.

The osseous structures are unremarkable.
IMPRESSION: No acute intracranial process.

[REDACTED]

## 2013-09-05 IMAGING — CR DG CHEST 1V PORT
1 series · 1 of 1 positions shown · non-contrast
Comparison: none

REASON FOR EXAM: AMS
COMMENTS:

[ap]
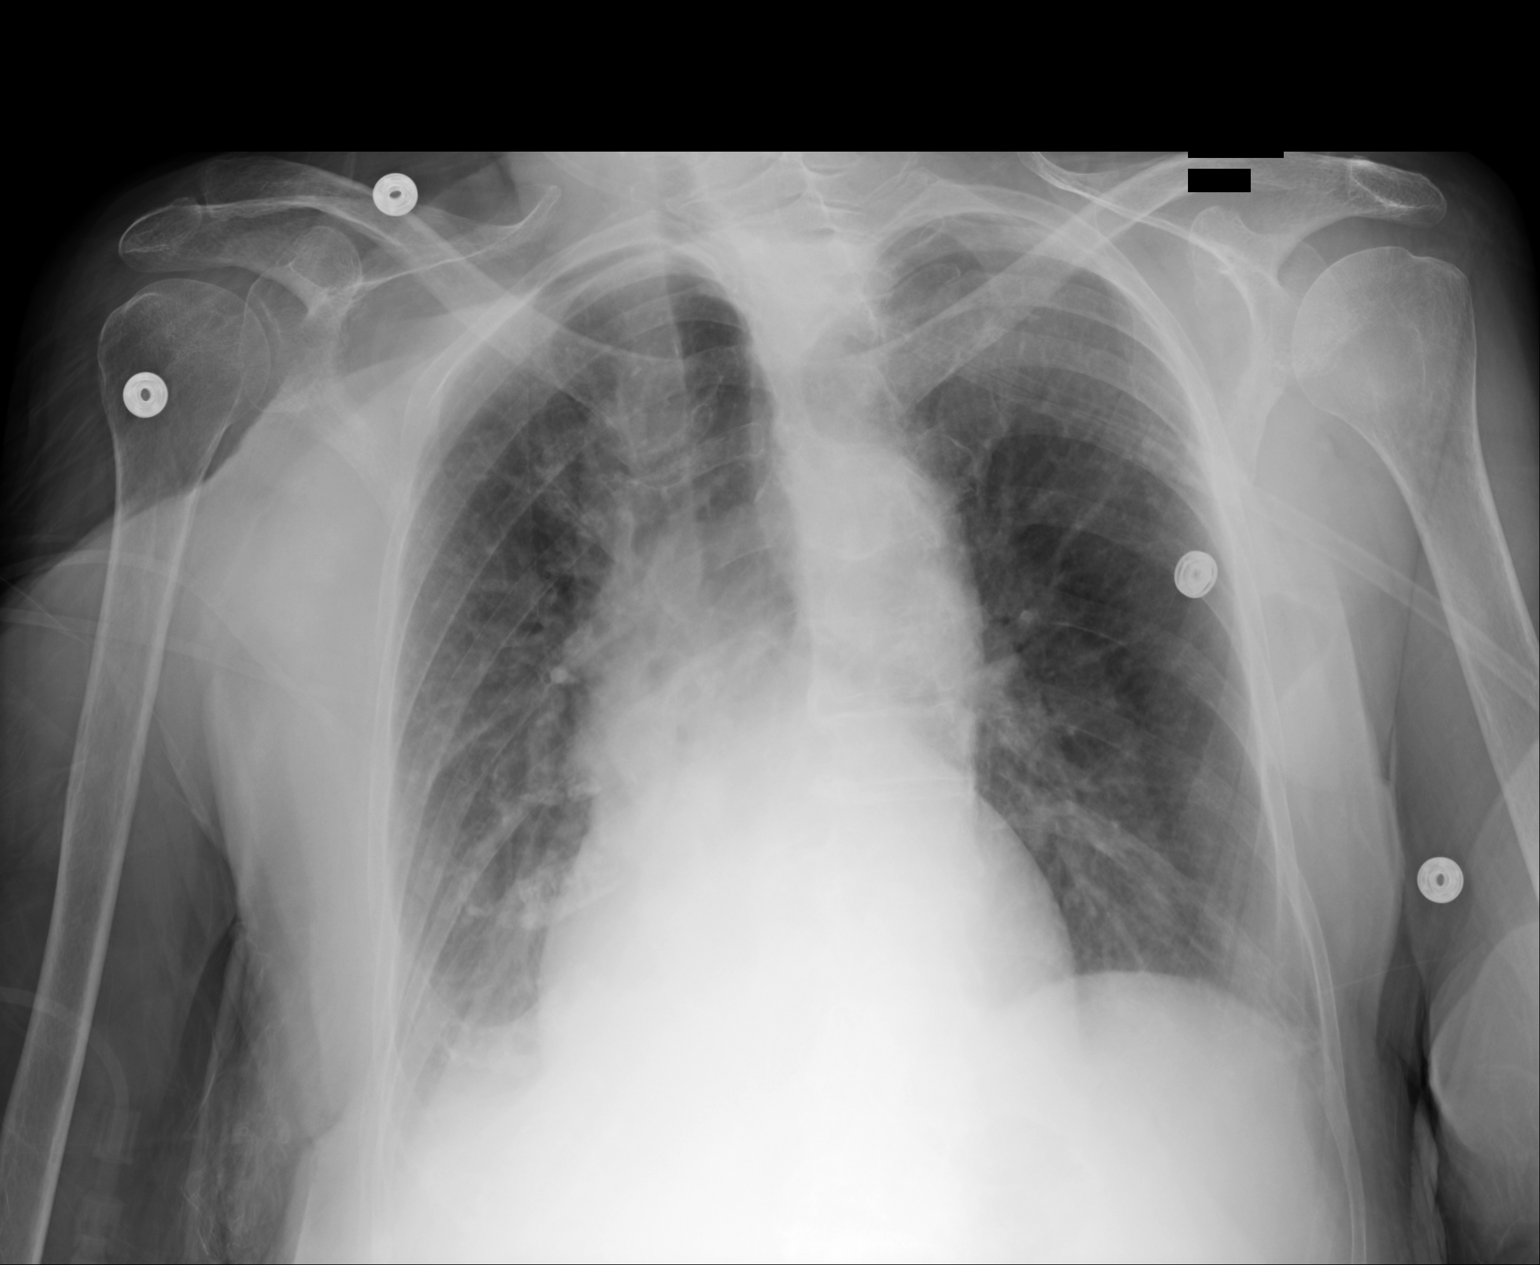

[1 of 1 positions shown; findings below may reference images not displayed]

PROCEDURE:     DXR - DXR PORTABLE CHEST SINGLE VIEW  - June 06, 2012  [DATE]

RESULT:     Patient is rotated toward the right. There appears to be mildly
increased density at the right lung base suspicious for underlying
atelectasis or infiltrate. The heart size appears normal. No pneumothorax or
masses appreciated. Bony structures are osteopenic.
IMPRESSION: Minimal right lung base atelectasis versus infiltrate
possibly with small right pleural effusion. Followup PA and lateral views
would be helpful when the patient's condition allows.

[REDACTED]

## 2013-09-07 IMAGING — CR DG CHEST 1V PORT
1 series · 1 of 1 positions shown · non-contrast
Comparison: none

REASON FOR EXAM: cough
COMMENTS:

[ap]
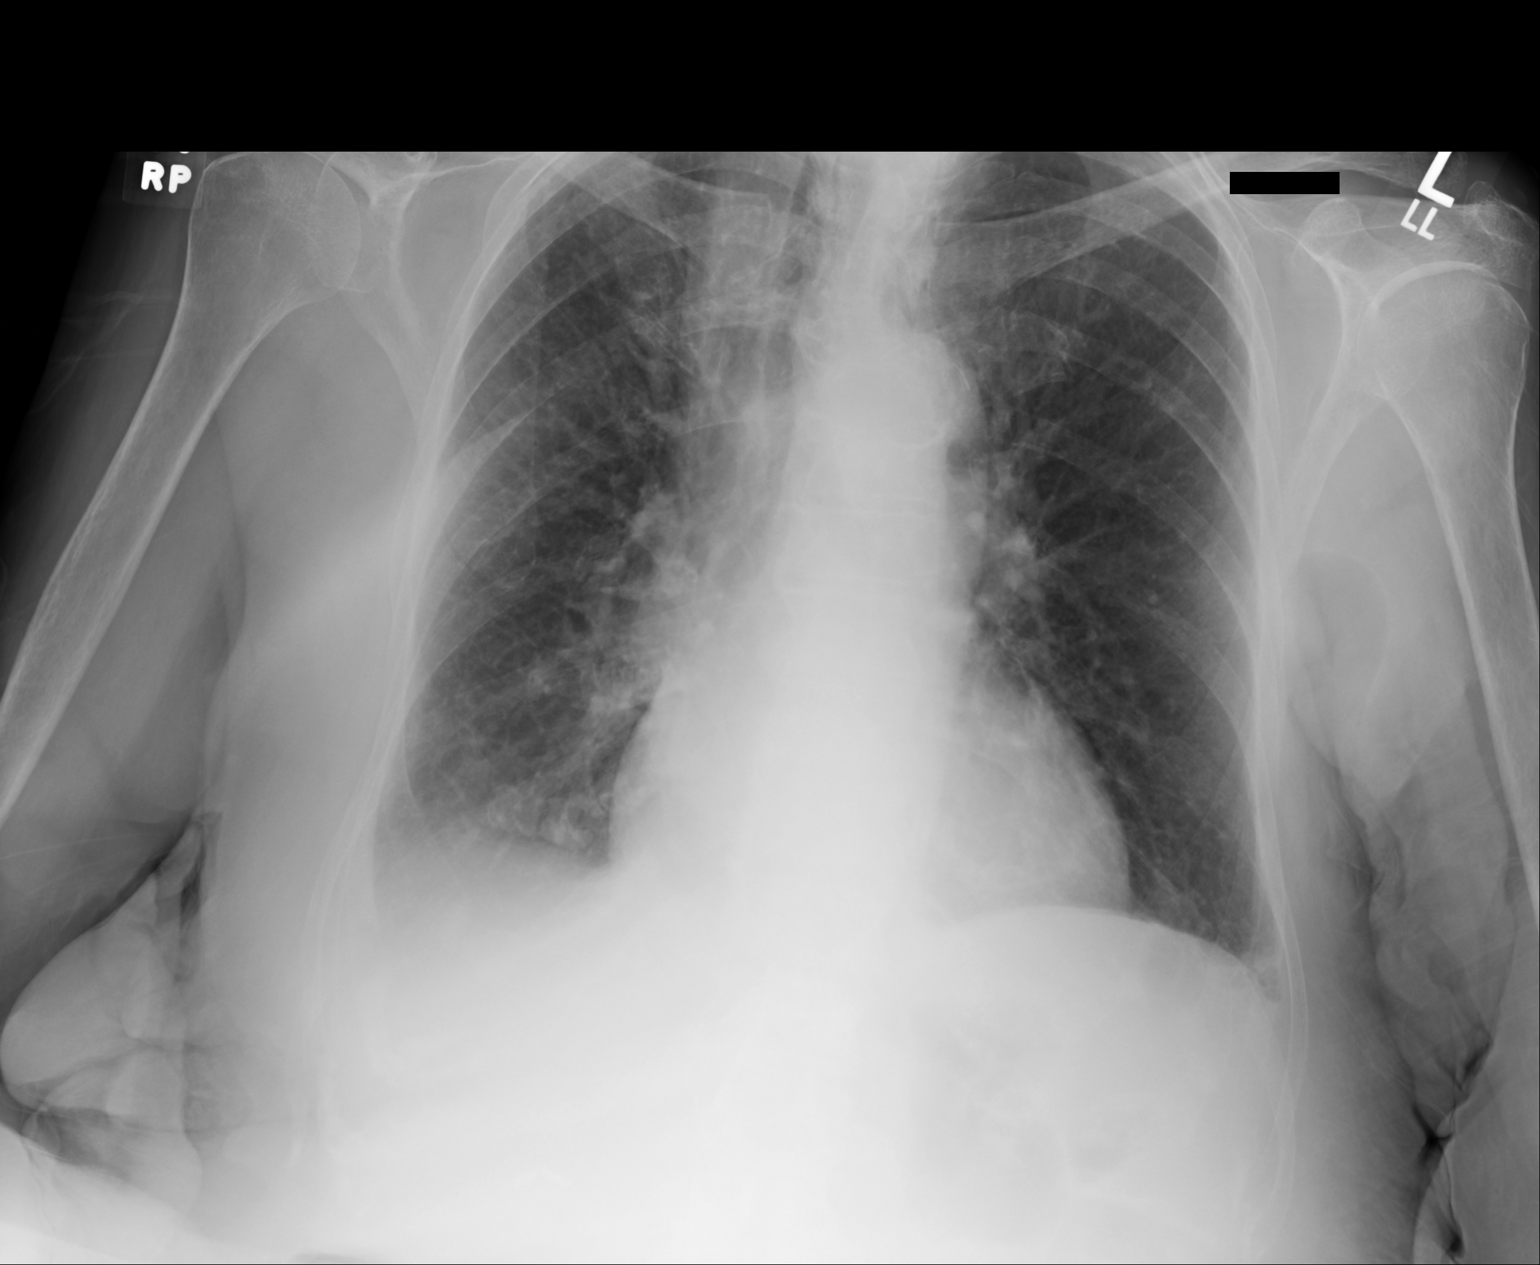

[1 of 1 positions shown; findings below may reference images not displayed]

PROCEDURE:     DXR - DXR PORTABLE CHEST SINGLE VIEW  - June 08, 2012 [DATE]

RESULT:     Comparison is made to the study June 06, 2012.

The lungs are well-expanded. The interstitial markings are increased and not
greatly changed from the earlier study. The cardiac silhouette is top normal
in size. The central pulmonary vascularity is mildly prominent.
IMPRESSION: The findings are consistent with COPD. There is no focal
pneumonia. I cannot exclude low-grade CHF in the appropriate clinical
setting. A followup PA and lateral chest x-ray would be of value.

[REDACTED]

## 2014-12-10 NOTE — Discharge Summary (Signed)
PATIENT NAME:  [name], [name] MR#:  [id number] DATE OF BIRTH:  24-Sep-1924  DATE OF ADMISSION:  06/05/2012 DATE OF DISCHARGE:  06/08/2012  FINAL DIAGNOSES:  1. Metabolic encephalopathy.  2. Chronic obstructive pulmonary disease with chronic respiratory failure.  3. Dementia.  4. Hypothyroidism.  5. Gastroesophageal reflux disease.  6. Hypertension.  7. History of chronic left-sided diastolic congestive heart failure.  8. Vitamin D deficiency.   HISTORY AND PHYSICAL: Please see dictated admission history and physical.   SUMMARY OF HOSPITAL COURSE: The patient was admitted with severe alteration in mental status. She was found to have evidence of dehydration and hypernatremia, which was corrected with IV fluids; however, mental status did not improve. She has had multiple admissions previously with altered mental status, usually related to urinary tract infection or other acute problems, however, none of these were noted other than as above. Neurology saw the patient and they were concerned that she had developed serotonin syndrome. She was taken off of sertraline and Seroquel and placed on benzodiazepines. Benzodiazepines were reduced and weaned as able, however, her mental status remained waxing and waning, ranging from lethargy to extreme agitation and visual hallucinations. We did not see tachycardia or significant hypertension symptoms that would have otherwise gone along with serotonin syndrome.  Multiple discussions were held with the family members regarding her overall prognosis, as she has had deterioration in her health for some time. The family members approached the medical team and stated that they wanted to move towards comfort measures only given the lack of progress and overall poor quality of life that further evaluation and treatment would not bring about an improvement. A palliative care consultation was obtained, and the patient was felt appropriate for hospice home which was the  request of the patient's family members as well. At this time, she will be discharged to hospice home. Her condition is stable, but prognosis very poor. Her diet will be as tolerated. Her activity will be as tolerated. She will be on oxygen 2 liters nasal cannula. We do not advise the patient to be weighed, salt restricted or otherwise monitored for heart failure, due to her terminal condition, and so these measures were not instituted. Additionally, we would not recommend putting on beta blocker, ACE inhibitor, diuretics for the same reasons.   DISCHARGE MEDICATIONS:  1. Lorazepam 0.5 to 1 mg p.o. or sublingually every two to four hours p.r.n. agitation or anxiety.  2. Ranitidine 150 mg p.o. b.i.d.  3. ABHR suppository one per rectum every 4 to 6 hours p.r.n. nausea or vomiting.  4. Ventolin metered-dose inhaler 2 puffs q.4 hours p.r.n. wheezing.   At this point all other medications will be held.   CODE STATUS: The patient is DO NOT RESUSCITATE. An out of facility DO NOT RESUSCITATE form was sent with her to the facility.   ____________________________ Lynnea FerrierBert J. Klein III, MD bjk:ap D: 06/08/2012 13:03:53 ET T: 06/08/2012 13:43:43 ET JOB#: [id number]  cc: Lynnea FerrierBert J. Klein III, MD, <Dictator> Daniel NonesBERT KLEIN MD ELECTRONICALLY SIGNED 06/09/2012 12:50

## 2014-12-10 NOTE — Consult Note (Signed)
Referring Physician:  Adin Hector   Primary Care Physician:  Adin Hector [facility], [address] [place], [place] [address], [place] [phone]  Reason for Consult:  Admit Date: 04-Jun-2012   Chief Complaint: altered mental status   Reason for Consult: altered mental status   History of Present Illness:  History of Present Illness:   81 yo RHD F who resides at [facility] presents with altered mental status for the past 2 days.  Pt has mild dementia as baseline but communicates and does most of her ADL's with assistance.  Daughter noted that 3 weeks ago, she was started on Seroquel and has become more agitated.  It was not been for the past 2 days that she has become somnolent and very aggressive which has further worsened since admission.  ROS:   Review of Systems   unobtainable secondary to mental status  Past Medical/Surgical Hx:  endocarditis:   encephalopathy:   Alzheimer's Disease:   CHF:   Asthma:   HTN:   head bleed:   Hysterectomy - Total:   Mastectomy:   Past Medical/ Surgical Hx:   Past Medical History 1. History of breast cancer.  2. History of mild dementia. 3. Hypothyroidism. 4. Chronic obstructive pulmonary disease. 5. Gastroesophageal reflux disease. 6. Hypertension. 7. History of diastolic congestive heart failure. 8. Vitamin D deficiency.  9. Hypothyroidism.    Past Surgical History see above   Home Medications: Medication Instructions Last Modified Date/Time  Advair Diskus 250 mcg-50 mcg inhalation powder 1 puff(s) inhaled 2 times a day (rinse mouth after each use) for chronic airway obstruction. 13-Oct-13 17:19  ferrous sulfate 325 mg (65 mg elemental iron) oral tablet 1 tab(s) orally 2 times a day for nutritional supplement. 13-Oct-13 17:19  Lasix 40 mg oral tablet 1 tab(s) orally once a day for congestive heart failure. 13-Oct-13 17:19  Lipitor 10 mg oral tablet 1 tab(s) orally once a day for hyperlipidemia  13-Oct-13 17:19  potassium chloride 20 mEq oral tablet, extended release 1 tab(s) orally once a day with 6-8 ounces of water for nutritional supplement **do not crush** 13-Oct-13 17:19  Singulair 10 mg oral tablet 1 tab(s) orally once a day for allergy reaction. 13-Oct-13 17:19  Spiriva 18 mcg inhalation capsule Inhale 1 cap(s) via handihaler once a day for wheezing. 13-Oct-13 17:19  Synthroid 50 mcg (0.05 mg) oral tablet 1 tab(s) orally once a day for hypothyroidism. 13-Oct-13 17:19  Ventolin HFA 90 mcg/inh inhalation aerosol 2 puff(s) inhaled every 4 hours as needed for wheezing.  13-Oct-13 17:19  Vitamin D3 2000 intl units oral tablet 1 tab(s) orally once a dayn for nutritional supplement. 13-Oct-13 17:19  Xanax 0.25 mg oral tablet 1 tab(s) orally every 8 hours as needed- for anxiety. 13-Oct-13 17:19  Seroquel 25 mg oral tablet 1 tab(s) orally once a day (at bedtime) for psychosis. 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   Social History: no tob, no EtOH, no illicits   Family History: n/c   Vital Signs: **Vital Signs.:   14-Oct-13 06:22  Vital Signs Type Routine   Temperature Temperature (F) 97.8   Celsius 36.5   Temperature Source axillary   Pulse Pulse 67   Respirations Respirations 20   Systolic BP Systolic BP 638   Diastolic BP (mmHg) Diastolic BP (mmHg) 52   Mean BP 76   Pulse Ox % Pulse Ox % 94   Pulse Ox Activity Level  At rest   Oxygen Delivery Room Air/ 21 %   Physical Exam:  General: moderate distress, ill appearing, slightly overweight    HEENT: normocephalic, sclera nonicteric, oropharynx clear but poor dentition   Neck: rigid and nonflexible, no JVD   Chest: CTA B, no wheezes, poor movement   Cardiac: RRR, no murmurs, 3+ pitting edema, trace pulses   Extremities: bruising throughout, limited ROM due to increased tone   Neurologic Exam:  Mental Status: obtunded, does not open or follow, very aggressive and tries to swing at me, mild dysathric curse words used   Cranial Nerves: pupils 19m B and reactive but we have to ply eyes open, face symmetric, good gag   Motor Exam: localizes all 4 ext equally but severely increased tone, mild tremor and myoclonus noted   Deep Tendon Reflexes: 4+ patellar with clonus, 2+ else where but difficult to get, + Babinski B   Sensory Exam: untestable   Coordination: untestable   Lab Results: Thyroid:  13-Oct-13 12:00    Thyroid Stimulating Hormone 1.46 (0.45-4.50 (International Unit)  ----------------------- Pregnant patients have  different reference  ranges for TSH:  - - - - - - - - - -  Pregnant, first trimetser:  0.36 - 2.50 uIU/mL)  Hepatic:  13-Oct-13 12:00    Bilirubin, Total 0.9   Alkaline Phosphatase 81   SGPT (ALT) 23   SGOT (AST)  49   Total Protein, Serum  5.9   Albumin, Serum  3.0  Routine Chem:  13-Oct-13 12:57    Ammonia, Plasma 29 (Result(s) reported on 04 Jun 2012 at 01:39PM.)  14-Oct-13 04:06    Glucose, Serum 89   BUN  19   Creatinine (comp) 0.84   Sodium, Serum  149   Potassium, Serum 3.6   Chloride, Serum  116   CO2, Serum 24   Calcium (Total), Serum  8.1   Anion Gap 9   Osmolality (calc) 298   eGFR (African American) >60   eGFR (Non-African American) >60 (eGFR values <617mmin/1.73 m2 may be an indication of chronic kidney disease (CKD). Calculated eGFR is useful in patients with stable renal function. The eGFR calculation will not be reliable in acutely ill patients when serum creatinine is changing rapidly. It is not useful in  patients on  dialysis. The eGFR calculation may not be applicable to patients at the low and high extremes of body sizes, pregnant women, and vegetarians.)   Result Comment POTASSIUM - Slight hemolysis, interpret results with  - caution.  Result(s) reported on 05 Jun 2012 at 04:42AM.  Cardiac:  13-Oct-13 12:00    CK, Total 92   CPK-MB, Serum 1.1 (Result(s) reported on 04 Jun 2012 at 12:44PM.)   Troponin I 0.02 (0.00-0.05 0.05 ng/mL or less: NEGATIVE  Repeat testing in 3-6 hrs  if clinically indicated. >0.05 ng/mL: POTENTIAL  MYOCARDIAL INJURY. Repeat  testing in 3-6 hrs if  clinically indicated. NOTE: An increase or decrease  of 30% or more on serial  testing suggests a  clinically important change)  Routine UA:  13-Oct-13 12:00    Color (UA) Yellow  Clarity (UA) Hazy   Glucose (UA) Negative   Bilirubin (UA) Negative   Ketones (UA) Negative   Specific Gravity (UA) 1.021   Blood (UA) Negative   pH (UA) 6.0   Protein (UA) Negative   Nitrite (UA) Negative   Leukocyte Esterase (UA) Negative (Result(s) reported on 04 Jun 2012 at 12:43PM.)   RBC (UA) 3 /HPF   WBC (UA) 2 /HPF   Bacteria (UA) TRACE   Epithelial Cells (UA) <1 /HPF   Mucous (UA) PRESENT   Amorphous Crystal (UA) PRESENT (Result(s) reported on 04 Jun 2012 at 12:43PM.)  Routine Hem:  13-Oct-13 12:00    WBC (CBC) 4.2   RBC (CBC)  3.47   Hemoglobin (CBC)  9.8   Hematocrit (CBC)  29.7   Platelet Count (CBC)  138   MCV 86   MCH 28.2   MCHC 33.0   RDW  16.1   Neutrophil % 66.8   Lymphocyte % 21.4   Monocyte % 8.3   Eosinophil % 2.7   Basophil % 0.8   Neutrophil # 2.8   Lymphocyte #  0.9   Monocyte # 0.4   Eosinophil # 0.1   Basophil # 0.0 (Result(s) reported on 04 Jun 2012 at 12:20PM.)  14-Oct-13 04:06    Erythrocyte Sed Rate 13 (Result(s) reported on 05 Jun 2012 at 08:24AM.)   Radiology Results: CT:    13-Oct-13 12:45, CT Head Without Contrast   CT Head Without Contrast    REASON FOR EXAM:    mental status  changes and questionable seizures  COMMENTS:       PROCEDURE: CT  - CT HEAD WITHOUT CONTRAST  - Jun 04 2012 12:45PM     RESULT: Comparison:  04/28/2012    Technique: Multiple axial images from the foramen magnum to thevertex   were obtained without IV contrast.    Findings:    There is no evidence for mass effect, midline shift, or extra-axial fluid   collections. There is no evidence for space-occupying lesion,   intracranial hemorrhage, or cortical-based area ofinfarction. Mild   periventricular hypoattenuation is likely sequela of chronic     microangiopathy.    There is a very small mucus retention cyst in the left maxillary sinus.    The osseous structures are unremarkable.    IMPRESSION:    No acute intracranial process.      Dictation Site: 8          Verified By: Gregor Hams, M.D., MD   Radiology Impression:  Radiology Impression: CT of head w/o contrast personally reviewed by me and shows mild white matter changes   Impression/Recommendations:  Recommendations:   case d/w referring physician reviewed and pretty unremarkable   Probable serotonin syndrome-  given the fact that she is taking a SSRI, has clonus and rigidity without elevated fever or CPK.  Differential includes menigitis, neuroleptic malignant syndrome, malignant catatonia and non-convulsive status epilepticus; all of which we highly doubt on exam.  Serotonin syndrome can cause autonomic instability which can be life threatening.  Typically this should improve over 24 hours and if it does not improve in the next 48 hours, we will look for other causes. Mild dementia-  worse than baseline now Hypernatremia-  not high enough to cause AMS  would observe in ICU overnight and watch for autonomic instability with frequent vital sign testing high dose benzos but would watch for respiratory suppression (ativan 76m IV now then 139mq4h IV)  PRN Ativan for  agitation as well agree with EEG once not agitated hold  Zoloft and seroquel will hold off on LP now in this agitated state will follow pt with you minutes of critical care time spent, reviewing chart, examining pt, and coordinating care.  Electronic Signatures: Jamison Neighbor (MD)  (Signed 14-Oct-13 13:34)  Authored: REFERRING PHYSICIAN, Primary Care Physician, Consult, History of Present Illness, Review of Systems, PAST MEDICAL/SURGICAL HISTORY, HOME MEDICATIONS, ALLERGIES, Social/Family History, NURSING VITAL SIGNS, Physical Exam-, LAB RESULTS, RADIOLOGY RESULTS, Recommendations   Last Updated: 14-Oct-13 13:34 by Jamison Neighbor (MD)

## 2014-12-10 NOTE — Consult Note (Signed)
Chief Complaint:   Subjective/Chief Complaint No new complaints. No obvious bleeding. H and H stable at 8.2.   VITAL SIGNS/ANCILLARY NOTES: **Vital Signs.:   18-Jul-13 19:45   Vital Signs Type Routine   Temperature Temperature (F) 98   Celsius 36.6   Temperature Source oral   Pulse Pulse 73   Respirations Respirations 18   Systolic BP Systolic BP 979   Diastolic BP (mmHg) Diastolic BP (mmHg) 67   Mean BP 88   Pulse Ox % Pulse Ox % 98   Pulse Ox Activity Level  At rest   Oxygen Delivery 2L   Lab Results: Routine Chem:  18-Jul-13 04:09    Glucose, Serum 89   BUN 11   Creatinine (comp) 0.88   Sodium, Serum 142   Potassium, Serum 4.2   Chloride, Serum  110   CO2, Serum 26   Calcium (Total), Serum  8.1   Anion Gap  6   Osmolality (calc) 282   eGFR (African American) >60   eGFR (Non-African American) >60 (eGFR values <24m/min/1.73 m2 may be an indication of chronic kidney disease (CKD). Calculated eGFR is useful in patients with stable renal function. The eGFR calculation will not be reliable in acutely ill patients when serum creatinine is changing rapidly. It is not useful in  patients on dialysis. The eGFR calculation may not be applicable to patients at the low and high extremes of body sizes, pregnant women, and vegetarians.)  Routine Hem:  18-Jul-13 04:09    WBC (CBC) 3.9   RBC (CBC)  2.98   Hemoglobin (CBC)  8.2   Hematocrit (CBC)  25.8   Platelet Count (CBC)  82   MCV 87   MCH 27.6   MCHC  31.8   RDW  17.8   Neutrophil % 58.2   Lymphocyte % 21.5   Monocyte % 12.7   Eosinophil % 6.8   Basophil % 0.8   Neutrophil # 2.2   Lymphocyte #  0.8   Monocyte # 0.5   Eosinophil # 0.3   Basophil # 0.0 (Result(s) reported on 09 Mar 2012 at 05:46AM.)   Assessment/Plan:  Assessment/Plan:   Assessment GI bleed, no signs of active bleeding. Chronic recurrent anemia as well as pancytopenia. GI AVM's.    Plan Agree with current conservative approach. Discussed  with multiple family members yesterday. Transfuse if needed. No GI intervention planned unless signs of significant active bleeding. Will sign off. Please call on call GI if needed. Thanks.   Electronic Signatures: IJill Side(MD)  (Signed [id number])  Authored: Chief Complaint, VITAL SIGNS/ANCILLARY NOTES, Lab Results, Assessment/Plan   Last Updated: 18-Jul-13 20:24 by IJill Side(MD)

## 2014-12-10 NOTE — H&P (Signed)
PATIENT NAME:  Beth Bell, Beth Bell MR#:  [id number] DATE OF BIRTH:  [birth year]  DATE OF ADMISSION:  04/28/2012  PRIMARY CARE PHYSICIAN: Daniel NonesBert Klein, III, MD, [facility].   REASON FOR ADMISSION: Acute delirium and urinary tract infection.   HISTORY OF PRESENT ILLNESS: Beth Bell is a very pleasant 81 year old female who has history of severe hearing loss, chronic obstructive pulmonary disease, chronic right-side systolic congestive heart failure, coronary artery disease, pancytopenia with myelodysplastic syndrome, valvular heart disease, and many others. The patient has been discharged from at the hospital on 03/10/2012 after she was admitted with a diagnosis of gastrointestinal bleeding and acute blood loss anemia. At that moment, the patient had significant red bright blood per rectum, and there were no procedures performed during that hospitalization due to her disease and the fact that the patient was on DO NOT RESUSCITATE at the moment. The patient actually at that time was transfused with a couple units of packed red blood cells and again sent to a skilled nursing facility.   During the last couple of weeks she has been very confused. Apparently she did not have a specific diagnosis of dementia before, but due to her severe hearing loss there has been a question that she could have dementia. The daughters state that she does not have it, that she as sharp as she could be up to the point that she went to the nursing home.   Since the patient has been there, there have been some changes with confusion, but up to the last couple of days she developed worsening of symptoms where she is hallucinating, seeing a man coming out of the TV who is trying to kill her and kill her daughter. The patient has been agitated but not violent, and she has always remained cooperative. She was found to have a urinary tract infection. The records are on her chart with the sensitivities. She has Escherichia coli. At the  beginning she was started on ciprofloxacin, and that medication was stopped due to the fact that the patient had resistant E. coli to quinolones, and she was started on Macrobid today. The daughters wanted to bring her over here just because they felt like she would have better care here. She has been put on Rocephin. The patient in the past has received Omnicef.  Despite the fact that she had a possible reaction to penicillin as a child, she did not have any gross reaction with cephalosporins.  REVIEW OF SYSTEMS: Unable to obtain review of systems directly from the patient due to delirium and due to severe hearing loss, although I was able to get some information from the family. Apparently the patient has not had any fevers. She does have weight loss because she is not eating. She does not like the food at the nursing home. Her eyes have not had any acute changes in her visual status. She wears glasses. ENT: There has not been any major problems with difficulty swallowing. She takes her regular diet with regular fluids. RESPIRATORY-WISE : There has not been any significant cough or sputum. CARDIOVASCULAR: She has not been complaining of any chest pain. There have not been any syncopal episodes. She does have chronic congestive heart failure and significant edema of the lower extremity, which apparently are better. GI: Loss of appetite. No acute rectal bleeding right now, but she has had chronic bleeding in the past. GU: There has not been any dysuria as far as the family can tell, but she does  have a urinary infection.  ENDOCRINOLOGY: Has not been any problems with her thyroid lately. Her doses of thyroid medication have been stable. HEMATOLOGIC/LYMPHATIC: No problems with bleeding or easy bruising. SKIN: There has not been any new rashes or skin rashes. MUSCULOSKELETAL: The patient has multiple pains on her lower extremities, for the most on her joints. No gout. NEUROLOGIC: As mentioned above, she has delirium.   PSYCHIATRIC: Mild agitation as mentioned above. There is no history of CVAs or TIAs as far as I can tell.   PAST MEDICAL HISTORY:  1. Chronic obstructive pulmonary disease, end stage.  2. Right-sided systolic congestive heart failure.  3. History of GI bleeding on the past admission.  4. Acute blood loss anemia, now stable, requiring transfusion on the last admission.  5. Coronary artery disease.  6. Myelodysplastic syndrome with pancytopenia.  7. Aortic valvular heart disease.   8. Dementia, although the family denies this problem.  9. Pulmonary hypertension.  10. History of breast cancer.  11. Gastroesophageal reflux.  12. Hypertension.  13. Chronic lower extremity edema.  14. Dyslipidemia.  15. Hypothyroidism.    16. Vitamin D deficiency.  17. Stage III chronic kidney disease.  18. Degenerative joint disease.  19. Significant hearing loss.  20. History of colon polyps.  21. History of skin cancer.  22. History of cataracts.   SURGICAL HISTORY:  1. Cholecystectomy.  2. Appendectomy.  3. Tonsillectomy.  4. Left ovarian resection.  5. Hysterectomy.  6. Knee surgery.  7. Left mastectomy.  8. Bilateral cataract surgery.   SOCIAL HISTORY: Apparently no history of smoking or alcohol drug use. Positive for secondhand smoking from husband.  Patient at [facility], residing there. She has been back for about couple of weeks, and her family was able to tell me about all her problems.   FAMILY HISTORY: Positive for heart disease and CVA.   ALLERGIES: The patient is allergic to sulfa, Levaquin,  codeine, penicillin, Zestril, Cipro, iodine, and shellfish.   CURRENT MEDICATIONS:  1. Zoloft 100 mg a day. 2. Lipitor 10 mg once daily.  3. Xanax 0.25 mg 3 times a day. 4. Advair Diskus 250/50.  5. Spiriva 18 mcg once a day. 6. Ventolin HFA 90 mcg every 4 hours.  7. Lasix 40 mg a day.  8. Ferrous sulfate 325 mg once a day. 9. Singular 10 mg once daily.  10. Potassium chloride  20 mEq extended release once a day. 11. Prilosec 40 mg once a day. 12. Cipro 250 mg twice daily has been stopped. 13. Synthroid 50 mcg once daily.  14. Vitamin D3 at 2000 units once a day.   PHYSICAL EXAMINATION:  VITAL SIGNS: Blood pressure 141/51, temperature 97.3, pulse 71, respiratory rate 20. Patient is comfortable, lying on bed, not acute distress. No respiratory distress. Hemodynamically stable. Very hard to hear but patient is corporative and not agitated at this moment. Her pupils are equal and reactive. Extraocular movements are intact. Mucosa is moist.  Her sclerae are anicteric. Her conjunctivae are pink. She looks a little bit pale. No oropharyngeal exudates. No oral lesions.   NECK: Supple. No JVD. No thyromegaly. No adenopathy. Trachea is central. No carotid bruits are auscultated.   CARDIOVASCULAR: Regular rate and rhythm. Positive systolic ejection murmur of 4 or 5 out of 6. Apical heart sounds mildly displaced to the left.   LUNGS: Mostly clear but there is some little rhonchi on posterior bases. No dullness to percussion.     ABDOMEN: Soft, nontender, nondistended.  No hepatosplenomegaly. No masses. Bowel sounds are positive.   GENITAL: Negative for external lesions. No rashes on groin.   EXTREMITIES: Positive chronic edema, chronic changes of vascular insufficiency. Pulses +1. Capillary refill less than 3. Sensation is maintained distally.   NEUROLOGIC: Cranial nerves II through XII seem to be normal, and there are no focal abnormalities on the exam.   PSYCHIATRIC: At this moment the patient is stable. There are no signs of agitation. No significant depression. Patient is very cooperative although she has been having significant delirium even here in the ER.   SKIN: Without any significant rashes, only with changes mentioned on the lower extremities with brown color the skin, thickening of the skin. No ulcers. No decubitus ulcers.   LYMPHATICS: Negative for  lymphadenopathy on neck, supraclavicular, or epitrochlear.   MUSCULOSKELETAL: Negative for joint effusions. Negative for significant joint abnormalities or edema.   LABORATORIES: Glucose is 102, BUN 24, sodium 144, potassium 3.7, albumin is 3.2. AST is 43, TSH is 3.62. Troponin is 0.03.  UDS is negative. Hemoglobin is 10.2, white count is 4.7, platelets are 124,000. Her platelets have been as low as 80,000 prior to today.   EKG: No acute changes compared with previous normal sinus rhythm.   ASSESSMENT AND PLAN:  1. Delirium.  Patient has a history of possible dementia. She has had a workup done, and Dr. Graciela Husbands is her doctor; and he knows her very well. There is no other psychiatric history. Psychiatry is going to be consulted to help on the case. Likely this is delirium due to acute disease like the urinary tract infection. We are going to just add on Xanax as a p.r.n.   2. Urinary tract infection, Escherichia coli sensitive to cephalosporin. Patient has history of allergy to penicillin but her cephalosporin has been given in the past, and she does not have any reaction. Continue to watch.   3. Systolic heart failure, chronic. At this moment there are no signs of exacerbation. We are going to go ahead and just keep an eye on that, try to avoid giving her lots of IV fluids. The patient does not look dehydrated, does not look overloaded for which we are just going to keep an eye on that.  4. Pancytopenia due to myelodysplastic syndrome. Her hemoglobin is fine. Her platelets are better than they are being before. Her white count is stable. No neutropenia.  5. Valvular heart disease. Severe aortic valvular disease is stable.  6. History of gastroesophageal reflux. Continue her medications with PPI.  7. Other medical problems seem to be stable. We are going to pass care to [facility] tomorrow morning.   TIME SPENT: I spent about 45 minutes with this patient today.     ____________________________ Felipa Furnace, MD rsg:vtd D: 04/28/2012 19:13:30 ET T: 04/29/2012 06:14:32 ET JOB#: [id number]  cc: Felipa Furnace, MD, <Dictator> Lynnea Ferrier, MD Pearletha Furl MD ELECTRONICALLY SIGNED 04/30/2012 13:01

## 2014-12-10 NOTE — Discharge Summary (Signed)
PATIENT NAME:  Beth Bell, Beth Bell MR#:  [id number] DATE OF BIRTH:  05/01/1925  DATE OF ADMISSION:  04/28/2012 DATE OF DISCHARGE:  05/02/2012  FINAL DIAGNOSES:  1. Metabolic encephalopathy secondary to urinary tract infection.  2. Urinary tract infection.  3. Mild dementia. Multiple discussions with family members, and level of dementia difficult to evaluate secondary to severe hearing loss, but does appear to be present.  4. Severe hearing loss.  5. Chronic obstructive pulmonary disease with chronic respiratory failure, chronically on 2 liters nasal cannula.  6. Chronic right-sided systolic congestive heart failure.  7. History of gastrointestinal bleeding with iron deficiency and blood loss anemia. Not evaluated with endoscopy previously secondary to her underlying medical problems, advanced age, and compliance with family members and patient's request.  8. Coronary artery disease.  9. History of mild dysplastic syndrome with pancytopenia.  10. Aortic valvular heart disease.  11. Pulmonary hypertension.  12. History of breast cancer. 13. Gastroesophageal reflux disease. 14. Hypertension.  15. Chronic lower extremity edema.  16. Hyperlipidemia.  17. Hypothyroidism, not requiring medication.  18. Vitamin D deficiency.  19. Chronic kidney disease, stage III. 20. Degenerative joint disease with decreased ambulation secondary to this.  21. History of colon polyps.  22. History of skin cancer.  23. Status post cholecystectomy.  24. Status post appendectomy.  25. Status post tonsillectomy.  26. History of hysterectomy with left ovarian resection.  27. History of knee surgery.  28. Status post left mastectomy.  29. Status post bilateral cataract repair.   HISTORY AND PHYSICAL: Please see dictated admission history and physical.   [place]: The patient was admitted with altered mental status. Again her mental status can be very challenging to evaluate due to severe hearing  loss and some of the delay in her responses secondary to this. It did appear she was worse than baseline. She had recently been on antibiotics for a urinary tract infection and was changed over to IV, then change over to oral antibiotics. Cultures here were negative, however, they done with the patient on antibiotics. Her mental status appeared to improve to baseline, which was confirmed with her family members.   She had her diuretics held temporarily as her oral intake had been reduced, and these will be resumed. She showed no significant evidence of heart failure symptoms, and an echocardiogram in June showed preserved LV function. Her renal function remained stable and her blood counts were also stable. She continues on iron therapy for history of iron deficiency and blood loss anemia.   She was ambulated by physical therapy and it was apparent that she would need to continue to work at skilled nursing level with rehabilitation to try to improve mobility. This was discussed with the family members and they were in agreement. At this time, she is stable for discharge to the facility and will be sent to the nursing home, in stable condition. She should be weighed daily, with physician called for more than 2 pound gain in one day or 5 pounds in one week or increasing signs or symptoms of heart failure. Her diet should be no added salt. She will be on 2 liters nasal cannula continuously. Physical therapy and occupational therapy should evaluate and treat the patient. Her physical activity will be up with a walker with assistance as tolerated. She will followup with the nursing home MD and she will need a MET-B, CBC, and urinalysis in one week with results to the nursing home MD.  DISCHARGE MEDICATIONS:  1. Singulair 10 mg p.o. at bedtime.  2. Advair 250/50 one puff twice a day. 3. Ferrous sulfate 325 mg p.o. daily.  4. Lipitor 10 mg p.o. daily.  5. Potassium 20 mEq p.o. daily.  6. Omeprazole 40 mg p.o.  daily.  7. Spiriva 1 capsule inhaled daily.  8. Synthroid 0.05 mg p.o. daily.  9. Ventolin HFA MDI 2 puffs every 4 hours p.r.n. wheezing.  10. Vitamin D3 1000 units p.o. daily.  11. Lasix 40 mg p.o. daily.  12. Xanax 0.25 mg p.o. three times daily for anxiety.  13. Sertraline 100 mg p.o. daily.  14. Keflex 500 mg p.o. three times daily x7 days.   NOTE: She is not on aspirin or other anticoagulants secondary to recent GI bleeding and the family members are aware of potential risk.   CODE STATUS: The patient is DO NOT RESUSCITATE. An out-of-facility order will be sent with her to the nursing facility.   PROGNOSIS: Poor.   TIME SPENT ON DISCHARGE: 45 minutes. ____________________________ Adin Hector, MD bjk:slb D: 05/02/2012 12:24:20 ET T: 05/02/2012 12:44:37 ET JOB#: [id number]  cc: Adin Hector, MD, <Dictator> Ramonita Lab MD ELECTRONICALLY SIGNED 05/16/2012 12:35

## 2014-12-10 NOTE — Discharge Summary (Signed)
PATIENT NAME:  Beth Bell, Beth Bell MR#:  [id number] DATE OF BIRTH:  [birth year]  DATE OF ADMISSION:  03/04/2012 DATE OF DISCHARGE:  03/10/2012  FINAL DIAGNOSES:  1. Gastrointestinal bleeding, likely lower, with acute blood loss anemia.  2. Chronic respiratory failure.  3. Chronic obstructive pulmonary disease, end-stage.  4. Chronic right-sided systolic congestive heart failure.  5. Coronary artery disease.  6. Pancytopenia, likely underlying myelodysplastic syndrome.  7. Aortic valvular heart disease.  8. Dementia.  9. Pulmonary hypertension.  10. History of breast cancer with left mastectomy.  11. Gastroesophageal reflux disease.  12. Hypertension.  13. Chronic lower extremity edema secondary to pulmonary hypertension.  14. Hyperlipidemia.  15. Hypothyroidism.  16. Vitamin D deficiency.  17. Chronic kidney disease, stage III. 18. Degenerative joint disease. 19. Significant hearing loss.  20. History of colon polyps.  21. History of skin cancer.  22. Status post cholecystectomy. 23. Status post appendectomy.  24. Status post tonsillectomy.  25. History of left ovarian resection with hysterectomy.  26. History of knee surgery. 27. Left mastectomy.  28. Bilateral cataract surgeries.   HISTORY AND PHYSICAL: Please see dictated admission history and physical.   [place]: The patient was admitted with bright red blood per rectum as well as acute blood loss anemia. She was transfused 2 units of packed cells during this hospitalization. Gastroenterology saw the patient, and they recommended not proceeding with endoscopy secondary to her underlying heart and lung disease and the fact that her bleeding appeared to be tapering off. Her blood counts did appear to stabilize, although with myelodysplastic syndrome she will have difficulty replacing these losses.   Her respiratory status and chronic right-sided systolic congestive heart failure appeared to be roughly stable and  her medications were continued for these.   Palliative care was asked to see the patient and family members due to the patient's multiple admissions and multiple medical problems. After discussion the patient was made DO NOT RESUSCITATE. We discussed the options of hospice given her overall poor prognosis and generalized decline over the last several months. Family members would like to see how she does with rehabilitation and see whether she can improve enough where she could be a bit more independent. They recognized that she really is not optimal to ever go back into a situation where she is independent completely, or at least living alone, but potentially could get to assisted living if she could improve her mobility. They are accepting, however, that if she has further significant medical problems or does not progress with physical therapy, that hospice would be the next intervention and they are comfortable with this plan.   At this time, the patient will be discharged to a nursing facility in stable condition with her physical activity to be up with assistance with a walker as tolerated. She should be weighed daily with physician called for more than 2 pounds gain in one day or 5 pounds in one week or increasing signs or symptoms of congestive heart failure. Her diet should be 2 grams sodium. Physical therapy and occupational therapy will evaluate and treat the patient. She will need a CBC and Met-B in one week with results to the nursing home physician. She will be on 2 liters nasal cannula continuously. Her follow-up will be with the nursing home physician.   DISCHARGE MEDICATIONS:  1. Advair 250/50 one puff twice a day. 2. Singulair 10 mg p.o. daily. 3. Spiriva one capsule inhaled daily.  4. Synthroid 0.05 mg p.o.  daily.  5. Lipitor 10 mg p.o. at bedtime.  6. Omeprazole 40 mg p.o. daily. 7. Vitamin D 2000 units p.o. daily.  8. Iron sulfate 325 mg p.o. daily.  9. Zoloft 100 mg p.o. daily.   10. Alprazolam 0.25 mg p.o. three times daily p.r.n. anxiety.  11. Albuterol metered-dose inhaler 2 puffs every 4 hours p.r.n. wheezing.  12. Lasix 40 mg p.o. daily p.r.n. edema.  13. Potassium 20 mEq p.o. daily p.r.n. with Lasix.   CODE STATUS: The patient is DO NOT RESUSCITATE. An out of facility DNR order will be sent to the nursing facility.   TIME SPENT ON DISCHARGE: 45 minutes  ____________________________ Adin Hector, MD bjk:slb D: 03/10/2012 08:06:24 ET T: 03/10/2012 08:15:31 ET JOB#: [id number]  cc: Adin Hector, MD, <Dictator> Ramonita Lab MD ELECTRONICALLY SIGNED 03/24/2012 13:35

## 2014-12-10 NOTE — Consult Note (Signed)
Chief Complaint:   Subjective/Chief Complaint Overall more alert and awake.   VITAL SIGNS/ANCILLARY NOTES: **Vital Signs.:   15-Jul-13 18:18   Vital Signs Type Routine   Temperature Temperature (F) 99.1   Celsius 37.2   Temperature Source Oral   Pulse Pulse 72   Respirations Respirations 18   Systolic BP Systolic BP 696   Diastolic BP (mmHg) Diastolic BP (mmHg) 63   Mean BP 86   Pulse Ox % Pulse Ox % 99   Pulse Ox Activity Level  At rest   Oxygen Delivery 2L   Lab Results: Routine Chem:  15-Jul-13 08:07    Glucose, Serum 95   BUN  23   Creatinine (comp) 0.97   Sodium, Serum  146   Potassium, Serum 3.9   Chloride, Serum  113   CO2, Serum 26   Calcium (Total), Serum  8.0   Anion Gap 7   Osmolality (calc) 294   eGFR (African American) >60   eGFR (Non-African American)  54 (eGFR values <24m/min/1.73 m2 may be an indication of chronic kidney disease (CKD). Calculated eGFR is useful in patients with stable renal function. The eGFR calculation will not be reliable in acutely ill patients when serum creatinine is changing rapidly. It is not useful in  patients on dialysis. The eGFR calculation may not be applicable to patients at the low and high extremes of body sizes, pregnant women, and vegetarians.)  Routine Hem:  15-Jul-13 08:07    WBC (CBC) 4.1   RBC (CBC)  3.23   Hemoglobin (CBC)  9.0   Hematocrit (CBC)  27.2   Platelet Count (CBC)  108   MCV 84   MCH 27.7   MCHC 33.0   RDW  17.8   Neutrophil % 68.6   Lymphocyte % 16.5   Monocyte % 11.2   Eosinophil % 3.1   Basophil % 0.6   Neutrophil # 2.8   Lymphocyte #  0.7   Monocyte # 0.5   Eosinophil # 0.1   Basophil # 0.0 (Result(s) reported on 06 Mar 2012 at 08:43AM.)   Assessment/Plan:  Assessment/Plan:   Assessment Recurrent anemia, most likely a combination of ? MDS and chronic GI blood loss from AVM's. Rectal bleeding. Better.    Plan Follow H and H. I do not believe further endoscopic evaluation will  change the management as we area lready aware of presence of colonic and probably small bowel AVM's. If she maintains her hemoglobin over the next day or so she can probably be discharged. Agree with hospice. Hematology consult may be helpful. Patient will likely drop her hemoglobin over next 24-48 hours due to hydration. Will follow.   Electronic Signatures: IJill Side(MD)  (Signed 15-Jul-13 19:01)  Authored: Chief Complaint, VITAL SIGNS/ANCILLARY NOTES, Lab Results, Assessment/Plan   Last Updated: 15-Jul-13 19:01 by IJill Side(MD)

## 2014-12-10 NOTE — H&P (Signed)
PATIENT NAME:  Beth Bell, Beth Bell MR#:  [id number] DATE OF BIRTH:  03-05-1925  DATE OF ADMISSION:  06/04/2012  PRIMARY CARE PHYSICIAN: Dr. Daniel Nones  CHIEF COMPLAINT: Altered mental status.   HISTORY OF PRESENT ILLNESS: This is an 81 year old female who resides at a skilled nursing facility at [facility] presented to the hospital due to altered mental status and unresponsiveness. Patient herself is a poor historian therefore most of the history obtained from the chart and also from the daughter at bedside. As per the daughter, patient does have some mild dementia and does have episodes or days that she is sleepy but for the past two days she has had significant increasing amount of lethargy and shaking episodes. Yesterday she was a bit agitated and received some Xanax for some anxiety. She became a bit more somnolent during the evening yesterday. This morning when the staff got her up to the bathroom she had an unresponsive episode and therefore she was sent over to the ER for further evaluation. In the Emergency Room, patient was still noted to be lethargic and had to be aroused with the help of a sternal rub. Her initial work-up just showed some mild hypernatremia. Her CT head is negative and her mental status is now back to baseline. As per the family and also as per the daughter patient has never had any episodes of seizure and there was no incontinence or seizure-type activity noted.   REVIEW OF SYSTEMS: CONSTITUTIONAL: No documented fever. No weight gain. No weight loss. EYES: No blurry or double vision. ENT: No tinnitus. No postnasal drip. No redness of the oropharynx. RESPIRATORY: No cough, no wheeze, no hemoptysis. CARDIOVASCULAR: No chest pain, no orthopnea, no palpitations, no syncope. GASTROINTESTINAL: No nausea, no vomiting, no diarrhea, no abdominal pain, no melena, no hematochezia. GENITOURINARY: No dysuria, no hematuria. ENDOCRINE: No polyuria or nocturia. No cold intolerance. HEME: No  anemia, no bruising, no bleeding. INTEGUMENTARY: No rashes. No lesions. MUSCULOSKELETAL: No arthritis, no swelling, no gout. NEUROLOGIC: No numbness, no tingling, no ataxia, no seizure-type activity. PSYCH: No anxiety, no insomnia, no ADD. Positive dementia.   PAST MEDICAL HISTORY:  1. History of breast cancer.  2. History of mild dementia. 3. Hypothyroidism. 4. Chronic obstructive pulmonary disease. 5. Gastroesophageal reflux disease. 6. Hypertension. 7. History of diastolic congestive heart failure. 8. Vitamin D deficiency.  9. Hypothyroidism.   ALLERGIES: Multiple medications including Cipro, codeine, iodine, Levaquin, penicillin, sulfa, Zestril and shellfish.   SOCIAL HISTORY: No history of smoking, alcohol abuse. Currently resides at [facility].   FAMILY HISTORY: Positive for heart disease and cerebrovascular accident.   CURRENT MEDICATIONS:  1. Advair 250/50, 1 puff b.i.d.  2. Ferrous sulfate 325 mg daily.  3. Lasix 40 mg daily.  4. Lipitor 10 mg daily.  5. Potassium 20 mEq daily.  6. Prilosec 40 mg daily.  7. Seroquel 25 mg at bedtime.  8. Singular 10 mg daily.  9. Spiriva 1 puff daily.  10. Synthroid 50 mcg daily.  11. Vitamin D3 2000 international units daily.  12. Xanax 0.25 mg every eight hours as needed.  13. Zoloft 100 mg at bedtime.   PHYSICAL EXAMINATION ON ADMISSION:  VITAL SIGNS: Patient's vital signs are noted to be: Temperature 99.7, pulse 56, respirations 20, blood pressure 130/57, sats 97% on room air.   GENERAL: Patient is a pleasant-appearing female, lethargic, but in no apparent distress.   HEENT: Patient is atraumatic, normocephalic. Extraocular muscles are intact. Pupils equal, reactive to light. Sclerae  anicteric. No conjunctival injection. No oropharyngeal erythema.   NECK: Supple. There is no jugular venous distention. No bruits, no lymphadenopathy, no thyromegaly.   HEART: Regular rate and rhythm. She does have a loud 2 to 3/6 systolic  ejection murmur heard at the right sternal border. No rubs, no clicks.   LUNGS: Clear to auscultation bilaterally. No rales, no rhonchi, no wheezes.   ABDOMEN: Soft, flat, nontender, nondistended. Has good bowel sounds. No hepatosplenomegaly appreciated.   EXTREMITIES: No evidence of any cyanosis, clubbing, or peripheral edema. Has +2 pedal and radial pulses bilaterally.   NEUROLOGICAL: Patient is alert, awake, oriented x1. Difficult to do a full neurological exam. She is globally weak, is hard of hearing therefore difficult to good neurological exam.   SKIN: Moist, warm with no rashes.   LYMPHATIC: There is no cervical or axillary lymphadenopathy.   LABORATORY, DIAGNOSTIC, AND RADIOLOGICAL DATA: Serum glucose 91, BUN 18, creatinine 0.9, sodium 150, potassium 4.3, chloride 114, bicarbonate 27. LFTs are within normal limits. TSH 1.4. Troponin 0.02. White cell count 4.2, hemoglobin 9.8, hematocrit 29.7, platelet count 138. Urinalysis is within normal limits.   Patient did have a CT of the head done which showed no evidence of any acute intracranial process.   ASSESSMENT AND PLAN: This is an 81 year old female with a history of breast cancer status post mastectomy, history of mild dementia, hypothyroidism, chronic obstructive pulmonary disease gastroesophageal reflux disease, hypertension, history of diastolic congestive heart failure, aortic stenosis presents to the hospital with altered mental status and episodes of shakiness.  1. Altered mental status. The exact etiology is unclear but I think this is likely related to her underlying dementia. She had two episodes of this shakiness and then became unresponsive for a prolonged period of time although she was not incontinent and she had no tonic-clonic activity. Unlikely this is related to seizures. Although if she has another episode would consider getting an EEG. I will observe her overnight and follow neuro checks. Her urinalysis is negative. Her  CT head is negative. She has had a history of previous intracranial bleed but her CT now is normal and her mental status is now back to baseline.  2. Hypernatremia. This is likely related to poor p.o. intake and concomitant use of Lasix. I will hold her Lasix for now and give her gentle IV fluids and follow her sodium in the morning.  3. Chronic obstructive pulmonary disease No evidence of acute exacerbation. Continue Advair, Spiriva and Singulair.  4. History of diastolic congestive heart failure. Clinically does not appear to be in heart failure. Hold her Lasix now and follow her clinically.  5. Hypothyroidism. Continue Synthroid.  6. Vitamin D deficiency: Continue vitamin D supplements.  7. Hyperlipidemia. Continue atorvastatin.  8. Mild dementia. Continue with Zoloft and Seroquel.  9. CODE STATUS: Patient is a DO NOT INTUBATE, DO NOT RESUSCITATE. Patient will be transferred over to Dr. Deland PrettyBert Klein's service.   TIME SPENT: 50 minutes.   ____________________________ Rolly PancakeVivek J. Cherlynn KaiserSainani, MD vjs:cms D: 06/04/2012 16:04:25 ET T: 06/05/2012 05:43:17 ET JOB#: [id number]  cc: Rolly PancakeVivek J. Cherlynn KaiserSainani, MD, <Dictator> Lynnea FerrierBert J. Klein III, MD Houston SirenVIVEK J Dewana Ammirati MD ELECTRONICALLY SIGNED 06/05/2012 13:47

## 2014-12-10 NOTE — Consult Note (Signed)
Chief Complaint:   Subjective/Chief Complaint Overall same. Hemoglobin dropped post transfusion as expected. No gross bleeding noted.   VITAL SIGNS/ANCILLARY NOTES: **Vital Signs.:   16-Jul-13 15:47   Vital Signs Type Routine   Temperature Temperature (F) 99.6   Celsius 37.5   Temperature Source Oral   Pulse Pulse 71   Pulse source if not from Vital Sign Device per cardiac monitor   Respirations Respirations 18   Systolic BP Systolic BP 136   Diastolic BP (mmHg) Diastolic BP (mmHg) 67   Mean BP 90   Pulse Ox % Pulse Ox % 96   Oxygen Delivery 2L   Lab Results: Routine Chem:  16-Jul-13 04:01    Potassium, Serum 3.5 (Result(s) reported on 07 Mar 2012 at 06:23AM.)  Routine Hem:  16-Jul-13 04:01    WBC (CBC) 4.4   RBC (CBC)  3.15   Hemoglobin (CBC)  8.6   Hematocrit (CBC)  26.7   Platelet Count (CBC)  92   MCV 85   MCH 27.3   MCHC 32.2   RDW  17.8   Neutrophil % 63.7   Lymphocyte % 20.2   Monocyte % 10.8   Eosinophil % 4.7   Basophil % 0.6   Neutrophil # 2.8   Lymphocyte #  0.9   Monocyte # 0.5   Eosinophil # 0.2   Basophil # 0.0 (Result(s) reported on 07 Mar 2012 at 06:23AM.)   Assessment/Plan:  Assessment/Plan:   Assessment Anemia, multifactorial. No signs of significant active GI bleed. Hemoglobin is expected to drop but hopefully slow enough for her to be able to be discharged and being followed as OP. Poor overall prognosis and agree with comfort/hospice care.   Electronic Signatures: Lurline DelIftikhar, Damien Batty (MD)  (Signed 16-Jul-13 18:19)  Authored: Chief Complaint, VITAL SIGNS/ANCILLARY NOTES, Lab Results, Assessment/Plan   Last Updated: 16-Jul-13 18:19 by Lurline DelIftikhar, Cherre Kothari (MD)

## 2014-12-15 NOTE — Consult Note (Signed)
PATIENT NAME:  Beth Bell, Beth Bell MR#:  [id number] DATE OF BIRTH:  1924/12/15  DATE OF CONSULTATION:  03/05/2012  REFERRING PHYSICIAN:   CONSULTING PHYSICIAN:  Lurline DelShaukat Adeeb Konecny, MD  REASON FOR CONSULTATION: Altered mental status, lower GI bleed.  PRIMARY CARE PHYSICIAN: Dr. Daniel NonesBert Klein  HISTORY OF PRESENT ILLNESS: 81 year old female with history of multiple medical problems including coronary artery disease, chronic obstructive pulmonary disease, chronic respiratory failure, pulmonary hypertension, gastroesophageal reflux disease, hypertension, pancytopenia, congestive heart failure, aortic stenosis. Patient was recently admitted to the hospital with bilateral lower extremity cellulitis and metabolic encephalopathy. Patient was readmitted yesterday because she was not feeling well. The skilled nursing facility called the patient's daughter as patient was not very responsive. She was somewhat pale. She also had some blood in her diaper and was transferred to the Emergency Room. Her frank red blood was found in patient's diaper according to the reports. Patient also was fairly lethargic. No nausea, vomiting, or hematemesis was reported. No melena was reported. Patient denied any abdominal pain. Her hemoglobin was 8.6 on admission, although it has dropped down to 6.5. Patient has been chronically anemic and has received blood transfusions recently. Patient's platelet count and white cell count is low as well.   PAST MEDICAL HISTORY:  1. Recent admission to [facility] with lower extremity cellulitis and encephalopathy.  2. History of chronic obstructive pulmonary disease on home oxygen. 3. Congestive heart failure. 4. Coronary artery disease. 5. Pancytopenia probably secondary to underlying myelodysplastic syndrome. 6. History of valvular heart disease with severe aortic stenosis. 7. History of fall. 8. Pulmonary hypertension.  9. Breast cancer. 10. Gastroesophageal reflux  disease. 11. Hypertension. 12. Chronic lower extremity edema.  13. Hyperlipidemia.  14. Hypothyroidism.  15. Chronic renal insufficiency. 16. Patient's last colonoscopy was in 2005 where multiple telangiectasias were noted.   PAST SURGICAL HISTORY:  1. Cholecystectomy.  2. Appendectomy.  3. Tonsillectomy.  4. Knee surgery.  5. Left mastectomy.  6. Bilateral cataract surgery.   SOCIAL HISTORY: Does not smoke or drink.   FAMILY HISTORY: Unremarkable.   ALLERGIES: Codeine, Levaquin, sulfa, penicillin, Zestril, Cipro, iodine and shellfish.   HOME MEDICATIONS:  1. KCl. 2. Ferrous sulfate. 3. Zoloft. 4. Xanax. 5. Advair.  6. Albuterol. 7. Singulair. 8. Spiriva.  9. Synthroid. 10. Lipitor. 11. Omeprazole. 12. Clindamycin. 13. Lasix. 14. Tylenol. 15. Vitamin D. 16. Aspirin.   REVIEW OF SYSTEMS: Not available because patient is very hard of hearing and very sleepy.     PHYSICAL EXAMINATION:  GENERAL: Patient is very frail appearing, elderly female. She does not appear to be in any acute distress, appears very drowsy.   VITAL SIGNS: Her vitals are fairly stable. Blood pressure 125/63, respirations 18, pulse is about 67, temperature 98.4.   LUNGS: Grossly clear to auscultation bilaterally with fair air entry.   CARDIOVASCULAR: Regular rate and rhythm. Loud systolic murmur was heard.   ABDOMEN: Examination is very benign. Bowel sounds are positive. Abdomen is soft and flat. No hepatosplenomegaly was noted. No ascites.   LABORATORY, DIAGNOSTIC AND RADIOLOGICAL DATA: Initial hemoglobin was 8.6 which has dropped down to 6.5. White cell count 5.2. Platelet count 174 on admission, although repeat platelet count is 132 which is low. Repeat platelet count is 92 which is low. Hemoglobin 6.5, MCV normal at 84, white cell count 3.3.   ASSESSMENT AND PLAN: Patient with:  1. Hematochezia. This is most likely secondary to internal hemorrhoids which were seen on endoscopy in 2005.   2. Chronic anemia. This  appears to be chronic recurrent problem and patient has required blood transfusions in the past. This is most likely secondary to her questionable underlying myelodysplastic syndrome as evidenced by low platelet count and low white cell count as well. Currently there are no signs of active GI bleeding. Patient has numerous other medical problems and she is in very poor overall health and is not a candidate for further GI intervention at this point. I would recommend following hemoglobin and hematocrit and transfusing if needed. Consider hematology consultation. Clear liquid diet, advance as tolerated. Further recommendations to follow.   ____________________________ Lurline Del, MD si:cms D: 03/05/2012 13:02:40 ET T: 03/05/2012 13:11:26 ET JOB#: [id number]  cc: Lurline Del, MD, <Dictator> Lurline Del MD ELECTRONICALLY SIGNED 03/24/2012 13:22

## 2014-12-15 NOTE — Consult Note (Signed)
PATIENT NAME:  Beth Bell, Beth M MR#:  [id number] DATE OF BIRTH:  1925/01/21  DATE OF CONSULTATION:  07/28/2011  REFERRING PHYSICIAN:   CONSULTING PHYSICIAN:  Knute Neuobert G. Gittin, MD  HISTORY OF PRESENT ILLNESS: Beth Bell is an 81 year old patient who was admitted on December 4th. The patient was admitted by the hospitalist and transferred to Dr. Odessa FlemingKlein's service who sees her for primary care. The patient fell and seemed to be a slip and a fall without any syncope. She has been evaluated by Cardiology. No other work up from Cardiology has been recommended. Apparently there has been some dyspnea and prednisone treatment taper as an outpatient. On admission the patient was ruled out for cardiac episode with serial enzymes and also had an echocardiogram. There was some hypokalemia that was replaced. It was noted that the patient was mildly anemic at baseline but subsequent likely hydration effect. The hemoglobin and the platelet count are lower so chronic anemia and thrombocytopenia with results lower than prior baseline. Hematology is consulted.   Past history includes anemia with hemoglobin range of 9.5 to 10 since at least 2008 with prior normal iron studies and platelets in the range of approximately 100,000 since at least 2008 with apparently no progression but now the hemoglobin is 7.9 and the platelet count is 79,000, was 72,000 this morning. Hemoglobin and hematocrit are stable during the day today. No evidence of active bleeding. No documentation of stool guaiacs. No evidence of suspicion of hemolysis. The patient has general weakness but has not been complaining of chest pain, palpitations, or shortness of breath.   REVIEW OF SYSTEMS: On my review of systems, the patient has no acute complaints. However, she is very hard of hearing and confused and could not otherwise give a history or answer questions. She could indicate that she was not in any pain.   PAST MEDICAL HISTORY:  1. Coronary artery disease.  Cardiac catheterization back in 2003 with some obstruction but apparently 60% at least one vessel.  2. Chronic obstructive pulmonary disease, 2 liters oxygen dependent. 3. Pulmonary hypertension. 4. Breast cancer with left mastectomy years ago.  5. Gastroesophageal reflux disease.  6. Hypertension. 7. Memory loss and dementia. 8. Chronic renal insufficiency, though current creatinine is unremarkable. 9. Degenerative joint disease.  10. Vitamin D deficiency. 11. Hearing loss. 12. Recurrent urinary tract infections.  13. Colon polyps. 14. Skin cancer. 15. Epistaxis. 16. Known anemia and thrombocytopenia dated to at least 2008. 17. Cholecystectomy. 18. Appendectomy. 19. Tonsillectomy. 20. Hysterectomy. 21. Left oophorectomy. 22. Knee surgery. 23. Cataract repair.   ALLERGIES: ACE inhibitors cause cough. Cipro, codeine, iodine causes rash. Penicillin causes rash. Shellfish cause rash. Sulfa causes vomiting.  MEDICATIONS AT THE TIME OF ADMISSION:  1. Advair Diskus. 2. Albuterol. 3. Aspirin 81 mg daily. 4. Doxycycline. 5. Guaifenesin. 6. Potassium. 7. Lasix 80 mg p.r.n.  8. Lipitor. 9. Omeprazole. 10. Recent prednisone taper. 11. Sertraline 50 mg daily.  12. Singular 10 mg daily.  13. Spiriva 18 mcg inhaled. 14. Synthroid 50 mcg daily.  15. Tussionex. 16. Vitamin D. 17. Xopenex.   SOCIAL HISTORY: There is no history of alcohol or smoking.   FAMILY HISTORY: No history of malignancy.   PHYSICAL EXAMINATION:   GENERAL: The patient was cooperative, alert, very hard of hearing. Would follow simple one-step command when she understood. Could state her doctor was Dr. Graciela HusbandsKlein. Could not give any other medical history. Could not state why she was in the hospital. There was no gross focal weakness.  HEENT: Sclerae no jaundice.   MOUTH: No thrush. There was mild pallor.   HEART: Regular. There is a systolic murmur.   LUNGS: Clear with decreased air entry.   ABDOMEN:  Nontender. No palpable mass or organomegaly is noted.   LYMPH: No palpable lymph nodes in the neck, supraclavicular, submandibular, or axilla. No significant bruising.   LABS ON ADMISSION: Hemoglobin 79.9, down to 7.9. Platelets were 99, down to 72 and then today platelets are stable at 79. Hematocrit 25. Liver functions were unremarkable. The creatinine was normal. Chest x-ray showed chronic obstructive pulmonary disease. Echocardiogram was also done.   IMPRESSION: Anemia and thrombocytopenia slightly more than the prior baseline. However, she has had anemia and thrombocytopenia since at least 2008. On CT there is evidence of varices and cirrhosis with some splenomegaly. The patient likely has myelodysplasia, chronic anemia, some element of hypersplenism. There is no evidence of active bleeding. Stool should be checked. Iron studies are still pending so iron deficiency is not ruled out. I do not have any suspicion of TTP. I will check the LDH and will watch serial CBCs. Also, really do not suspect myeloma but serum and urine immunoelectrophoresis should be checked and those results are pending. Erythropoietin level should also be checked.   PLAN: Check the outstanding serum and urine immunoelectrophoresis and serum iron and would check a haptoglobin tomorrow. Would check an erythropoietin level and a Coombs test for completeness. Would check stool guaiacs. Watch for any gross bleeding. I would defer to Medicine whether acutely it is indicated either because of symptoms or cardiac history or chronic obstructive pulmonary disease or cognitive function if transfusion is indicated. Otherwise, if the erythropoietin level is not severely elevated at baseline, and the patient is going to have a persistent anemia that is causing symptoms impacting lifestyle contributing to lung or cardiac decompensation, then could look at Procrit maintenance as an outpatient in the [facility] to bring up the hemoglobin modestly.  This is always balanced against potential side effects of Procrit. Also, I would need to review and plan treatment with a reliable caretaker in the absence of the patient's ability to meaningfully participate in directing her care.   ____________________________ Knute Neu. Lorre Nick, MD rgg:drc D: 07/28/2011 14:23:59 ET T: 07/28/2011 17:11:18 ET JOB#: [id number]  cc: Knute Neu. Lorre Nick, MD, <Dictator> Marin Roberts MD ELECTRONICALLY SIGNED 08/30/2011 11:48

## 2014-12-15 NOTE — Consult Note (Signed)
PATIENT NAME:  Beth Bell, Arlisa M MR#:  [id number] DATE OF BIRTH:  [birth year]  DATE OF CONSULTATION:  02/16/2012  REFERRING PHYSICIAN:  Daniel NonesBert Klein, MD CONSULTING PHYSICIAN:  Lamar BlinksBruce J. Brayla Pat, MD  PRIMARY CARE PHYSICIAN: Daniel NonesBert Klein, MD  REASON FOR CONSULTATION: Elevated troponin with chronic obstructive pulmonary disease and altered mental status with chronic kidney disease, hyperlipidemia, and aortic valve disease.   CHIEF COMPLAINT: The patient is somewhat obtunded and has an altered mental status.  HISTORY OF PRESENT ILLNESS: This is an 81 year old female with apparent hypertension, hyperlipidemia, and chronic kidney disease with new onset altered mental status of unknown etiology. The patient has not had any significant apparent shortness of breath or chest pain at this time and she has had an EKG showing normal sinus rhythm with first-degree AV block. Troponin and CK/MB were performed and she has a minimal elevation of troponin of 0.14 more consistent with her illness rather than acute coronary syndrome. The patient has had no evidence of symptoms today. She does have an aortic valve disease with aortic valve stenosis murmur.  REVIEW OF SYSTEMS: The remainder review of systems cannot be assessed due to altered mental status.   PAST MEDICAL HISTORY:  1. Chronic obstructive pulmonary disease. 2. Chronic kidney disease.  3. Hyperlipidemia.  4. Valvular heart disease.  5. Hypertension.   FAMILY HISTORY: No family members with early onset of cardiovascular disease or hypertension.   SOCIAL HISTORY: Currently denies alcohol or tobacco use.   PHYSICAL EXAMINATION:   VITAL SIGNS: Blood pressure 126/68 bilaterally and heart rate 72 upright and reclining and regular.   GENERAL: She is a well-appearing elderly female in no acute distress.   HEENT: No icterus, thyromegaly, ulcers, hemorrhage, or xanthelasma.   CARDIOVASCULAR: Regular rhythm with normal S1 and soft S2 with a 3/6 right upper  sternal border murmur radiating throughout. PMI is diffuse. Carotid upstroke is normal without bruit. Jugular venous pressure is normal.   LUNGS: Lungs have bibasilar crackles with normal respirations.   ABDOMEN: Soft and nontender without hepatosplenomegaly or masses. Abdominal aorta is normal size without bruit.   EXTREMITIES: 2+ radial, femoral, dorsal and pedal pulses with no lower extremity edema, cyanosis, clubbing, or ulcers.   NEUROLOGIC: She is not oriented at this time and somewhat having altered mental status.   MUSCULOSKELETAL: Normal muscle tone without kyphosis.   ASSESSMENT: An 81 year old female with hypertension, hyperlipidemia, and altered mental status with elevated troponin more consistent with current illness rather than acute coronary syndrome with a murmur, undiagnosed, and abnormal EKG needing further treatment.   RECOMMENDATIONS:  1. No additional medication and just management for hypertension due to good blood pressure control at this time.  2. Further investigation of causes of current condition.  3. Further serial ECGs and enzymes to assess for possible extent of myocardial infarction.  4. Echocardiogram for LV systolic dysfunction and murmur that is undiagnosed.    5. Ambulate and follow for any other significant symptoms requiring further intervention and treatment options.  ____________________________ Lamar BlinksBruce J. Ileene Allie, MD bjk:slb D: 02/16/2012 13:28:38 ET T: 02/16/2012 15:46:13 ET JOB#: [id number]  cc: Lamar BlinksBruce J. Izac Faulkenberry, MD, <Dictator> Lamar BlinksBRUCE J Ellouise Mcwhirter MD ELECTRONICALLY SIGNED 02/17/2012 8:32

## 2014-12-15 NOTE — H&P (Signed)
PATIENT NAME:  Beth Bell, Beth Bell MR#:  [id number] DATE OF BIRTH:  06-Jul-1925  DATE OF ADMISSION:  03/04/2012  PRIMARY CARE PHYSICIAN: Daniel Nones, MD   PULMONOLOGIST: Ned Clines, MD   CHIEF COMPLAINT: Altered mental status, lower GI bleed.   HISTORY OF PRESENT ILLNESS: The patient is an 82 year old female with past medical history of multiple medical problems including CAD, COPD with chronic respiratory failure on home O2, pulmonary hypertension, gastroesophageal reflux disease, hypertension, history of pancytopenia, chronic systolic/right-sided CHF, and chronic lower extremity edema who was recently admitted to [facility] from 06/26 to 02/21/2012 with bilateral lower extremity cellulitis and metabolic encephalopathy. She was discharged to [facility] and, as per the patient's daughter at bedside, was supposed to be discharged to assisted living facility this coming Tuesday because she was doing well. This morning the patient was not acting right, therefore, the skilled nursing facility called the patient's daughter who went to see the patient. The patient was very slow to respond, pale, and not able to recognize her daughter. Subsequently, she was observed to have some blood in her diaper, therefore, she was sent to the Emergency Room. As per the patient's family at bedside, she patient is still not back to her baseline. She is very slow to respond, very drowsy and lethargic. In the patient's diaper, frank bright red blood was found. She is taking a baby aspirin. As per the patient's daughter, Dr. Mechele Collin was considering a colonoscopy on the patient, however, the patient was considered to be too high risk given her underlying COPD, aortic stenosis, and CHF.   PAST MEDICAL HISTORY:  1. Recent admission to [facility] 06/26 to 02/21/2012 and right lower extremity cellulitis, metabolic encephalopathy, chronic respiratory failure due to COPD disease on home O2.  2. Chronic right-sided systolic CHF. 3. CAD, last  catheterization in 2003 which revealed 50 to 60% lesions. Echo December 2011 with normal EF. 4. Pancytopenia with likely underlying myelodysplastic syndrome requiring transfusions. 5. Valvular heart disease with moderate to severe aortic valvular heart disease.  6. History of fall, contusion, and hematoma. 7. Pulmonary hypertension. 8. Breast cancer status post left mastectomy in 1976 not requiring adjuvant therapy. 9. Gastroesophageal reflux disease. 10. Hypertension. 11. Chronic lower extremity edema. 12. Hyperlipidemia. 13. Hypothyroidism. 14. Vitamin D deficiency. 15. Chronic renal insufficiency. 16. Degenerative joint disease in the joints and disk. 17. Hearing loss with bilateral hearing aids. 18. Recurrent urinary tract infections. 19. Colonic polyps.  20. Skin cancer. 21. History of recurrent nosebleeds.   PAST SURGICAL HISTORY:  1. Cholecystectomy.  2. Appendectomy.  3. Tonsillectomy. 4. Left ovarian resection with hysterectomy. 5. Knee surgery. 6. Left mastectomy.  7. Bilateral cataract surgery.   SOCIAL HISTORY: No history of smoking, alcohol, or drug abuse, however, the patient was exposed to secondhand smoke for numerous years. The patient is currently residing at [facility]. As per the patient's daughter, she was supposed to be discharged to assisted living facility this coming week.   FAMILY HISTORY: Positive for heart disease and CVA.   ALLERGIES: Codeine, Levaquin, sulfa, penicillin, Zestril, Cipro, iodine, shellfish.   CURRENT MEDICATIONS:  1. KCl 20 mEq daily.  2. Ferrous sulfate 325 mg daily.  3. Zoloft 100 mg daily.  4. Xanax 0.25 mg t.i.d. p.r.n.  5. Advair 250/50 1 puff b.i.d.  6. Albuterol MDI 2 puffs q.4 hours p.r.n.  7. Singulair 10 mg daily.  8. Spiriva 18 mcg inhaled daily. 9. Synthroid 50 mcg daily.  10. Lipitor 10 mg daily.  11.  Omeprazole 40 mg daily.  12. Clindamycin 300 mg t.i.d. for 10 days.  13. Lasix 40 mg b.i.d.  14. Tylenol  p.r.n.  15. Vitamin D 2000 international units daily.  16. Aspirin 81 mg daily.   REVIEW OF SYSTEMS: The patient is unable to provide reliable review of systems. She is very hard of hearing, very slow to respond, lethargic and drowsy.   PHYSICAL EXAMINATION:   VITAL SIGNS: Temperature 96.9, heart rate 75, respiratory rate 16, blood pressure 144/55, pulse oximetry 97% on 2 liters of oxygen.   GENERAL: The patient is an elderly Caucasian female laying in bed. She is pale, very lethargic, drowsy.   HEAD: Atraumatic, normocephalic.   EYES: There is pallor. No icterus or cyanosis. Pupils equal, round, and reactive to light and accommodation. Extraocular movements intact.   ENT: Wet mucous membranes. No oropharyngeal erythema or thrush.   NECK: Supple. No masses. No JVD. No thyromegaly or lymphadenopathy.   CHEST WALL: No tenderness to palpation. Not using accessory muscles for respiration. No intercostal muscle retractions.   LUNGS: Bilaterally clear to auscultation. No wheezing, rales, or rhonchi.   CARDIOVASCULAR: S1, S2 regular. There is a systolic murmur. No rubs or gallops.   ABDOMEN: Soft, nontender, nondistended. No guarding. No rigidity. No organomegaly. Normal bowel sounds.   SKIN: No rashes or lesions.   PERIPHERIES: There is some pedal edema. 1+ pedal pulses.   MUSCULOSKELETAL: No cyanosis or clubbing.   NEUROLOGIC: The patient is very lethargic. She is very hard of hearing. Follows simple commands. Generalized weakness. No focal abnormalities.   PSYCH: Oriented to place only. Very hard of hearing. Flat affect.   LABORATORY, DIAGNOSTIC, AND RADIOLOGICAL DATA: Lactic acid 1.8. White count 5.2, hemoglobin 8.6, hematocrit 26.2, platelet count 174. Lipase 113. Glucose 128, BUN 25, creatinine 1.16. Electrolytes normal. AST 54, albumin 3.2. PT, INR, PTT normal. BNP 1077.   ASSESSMENT AND PLAN: This is an 81 year old female with multiple medical problems who presents with  altered mental status and lower GI bleed.  1. Altered mental status possibly due to lower GI bleed. Will monitor the patient closely.  2. Lower GI bleed. The patient's daughter reports that she has possibly had lower GI bleed in the past. She is on aspirin. She has a history of colonic polyps. Colonoscopy was not done because of the patient's multiple medical problems. Will admit the patient to the hospital. Keep her n.p.o. except medications and ice chips. Will hold her aspirin given her multiple medical problems, altered mental status, CAD, COPD. Will give her 1 unit of blood and monitor her hemoglobin and hematocrit closely. Will obtain a GI consultation and start patient on PPI. Avoid any anticoagulants.  3. Lactic acidosis possibly due to hypoperfusion resulting from lower GI bleed. Will hold the patient's antihypertensive medication and cautiously hydrate her with fluids and resuscitate with blood.  4. Anemia, possibility of pancytopenia with anemia with hemoglobin of 8.6. The patient has a history of pancytopenia and likely has acute blood loss anemia in addition to her chronic anemia. Will hold her iron therapy for the time being. Obtain a GI consultation. Resuscitate with blood and fluids.  5. Hyperglycemia, possibly reactive.  6. Chronic respiratory failure due to COPD on home O2. Will continue oxygen supplementation, Advair, Spiriva, and MDI.   7. History of hyperlipidemia. Will continue Lipitor.  8. History of hypothyroidism. Will continue Synthroid. 9. Vitamin D deficiency. Will continue Vitamin D supplementation.  10. Will place on GI and DVT prophylaxis.  Reviewed old medical records, discussed with the ED physician, discussed with the patient's daughter at bedside. The patient's eldest daughter is her healthcare power-of-attorney. I discussed CODE STATUS. The patient is FULL CODE for the time being, however, the patient's children will talk amongst themselves and readdress it.   TOTAL  TIME SPENT: 75 minutes.   ____________________________ Darrick MeigsSangeeta Ahria Slappey, MD sp:drc D: 03/04/2012 13:07:00 ET T: 03/04/2012 13:31:17 ET JOB#: [id number]  cc: Darrick MeigsSangeeta Shawnette Augello, MD, <Dictator> Lynnea FerrierBert J. Klein III, MD Darrick MeigsSANGEETA Sherhonda Gaspar MD ELECTRONICALLY SIGNED 03/04/2012 20:59

## 2014-12-15 NOTE — H&P (Signed)
PATIENT NAME:  Beth Bell, Beth Bell MR#:  [id number] DATE OF BIRTH:  March 10, 1925  DATE OF ADMISSION:  02/16/2012  REFERRING PHYSICIAN: Dr. Jens SomWiegand   PRIMARY PHYSICIAN: Dr. Graciela HusbandsKlein    PRESENTING COMPLAINT: Altered mental status.   HISTORY OF PRESENT ILLNESS: Beth Bell is an 81 year old woman with history of coronary artery disease, chronic obstructive pulmonary disease on chronic oxygen, pulmonary hypertension, breast cancer status post left mastectomy, pancytopenia presumed myelodysplastic syndrome, hypothyroidism, hyperlipidemia, and congestive heart failure who presents with reports of altered mental status. The patient is not able to provide a good history and interview was complicated with her significant hearing loss. No family members in the room. Apparently the patient was brought in by EMS with development of altered mental status and fever. The patient reports that she was having shakes and does not recall why she came to the hospital. She denies any chest pain. She has chronic dyspnea on exertion. She has chronic lower extremity edema but difficult to assess the severity and how long it's been present. She reports drainage which was not there before, unsure if the erythema is new or how long it has been there.   PAST MEDICAL HISTORY:  1. Admitted December 2012 status post fall with head contusion and hematoma.  2. Coronary artery disease with catheterization in 2003 revealing for 50 to 60% lesions. Echocardiogram December 2011 with normal EF.  3. Chronic obstructive pulmonary disease, on chronic oxygen.  4. Pulmonary hypertension.  5. Breast cancer, status post left mastectomy in 1976 not requiring adjuvant therapy.  6. Gastroesophageal reflux disease.   7. Hypertension.  8. Pancytopenia, suspect myelodysplastic disorder.  9. Systolic dysfunction congestive heart failure.  10. Chronic lower extremity edema.  11. Hyperlipidemia.  12. Hypothyroidism.  13. Vitamin D deficiency.  14. History of  chronic renal insufficiency.  15. Degenerative disk disease/degenerative joint disease.   16. Hearing loss with bilateral hearing aids.  17. Vitamin D deficiency.  18. Recurrent urinary tract infections.  19. Colonic polyps.  20. Skin cancer.  21. History of recurrent nosebleeds.   PAST SURGICAL HISTORY:  1. Cholecystectomy.  2. Appendectomy.  3. Tonsillectomy.  4. Left ovary resection with hysterectomy.  5. Knee surgery.  6. Left mastectomy.  7. Bilateral cataract surgery.   ALLERGIES: Cipro, codeine, iodine, Levaquin, penicillin, sulfa, Zestril, shellfish.   MEDICATIONS:  1. Advair Diskus 250/50 b.i.d.  2. Albuterol inhaler 2 puffs every six hours as needed.  3. Meclizine 25 mg every eight hours as needed.  4. Imodium 1 mg daily as needed.  5. Lasix 80 mg daily as needed.  6. Potassium chloride 20 mEq daily as needed.  7. Probiotics  one capsule daily.  8. Singulair 10 mg daily.  9. Spiriva daily.  10. Xopenex nebulizer every four hours as needed.  11. Sertraline 50 mg daily.  12. Vitamin D 2000 units daily.  13. Synthroid 50 mcg daily.  14. Lipitor 10 mg p.o. at bedtime.  15. Omeprazole 40 mg daily.   SOCIAL HISTORY: Lives in [place]. She reports living alone currently. No tobacco, alcohol, or drug use. From previous records, she was exposed to secondhand smoking for numerous years.   FAMILY HISTORY: Heart disease as well as stroke.   REVIEW OF SYSTEMS: CONSTITUTIONAL: She endorses chills. No vomiting. ENT: No epistaxis, discharge, dysphagia. She has significant hearing loss. RESPIRATORY: No cough or hemoptysis Denies any orthopnea. CARDIOVASCULAR: No chest pain, palpitations, or syncope. GU: No dysuria or hematuria. ENDOCRINE: No polyuria or polydipsia. HEME:  She has easy bruising. SKIN: She has open lesion in right lower extremity in the anterior aspect. NEUROLOGIC: No dysarthria or aphasia. She has symmetrical strength. PSYCH: She is alert and currently able to  provide appropriate history during the interview.   PHYSICAL EXAMINATION:   VITAL SIGNS: Temperature 99.5, pulse 84, respiratory rate 20, blood pressure 114/53, sating at 97% on room air.   GENERAL: Lying in bed in no apparent distress.   HEENT: Normocephalic, atraumatic. Pupils are equal, nonicteric. Nares without discharge. She has slightly dry mucous membrane.   NECK: Soft and supple. No adenopathy. She has mildly elevated JVP of approximately 7 cm.   CARDIOVASCULAR: Non-tachy. She has a systolic murmur. No rubs or gallops.   LUNGS: Faint basilar crackles. No use of accessory muscles or increased respiratory effort.  ABDOMEN: Soft. Positive bowel sounds. No mass appreciated.  EXTREMITIES: 3+ pitting edema right greater than left. There is an open wound with drainage of serosanguineous fluid. Leg is warm to the touch. Dorsal pedis pulses intact.   MUSCULOSKELETAL: No joint effusion.   PSYCH: She is alert and appears to be at her baseline mentation. She does have mild dementia.   PERTINENT LABS AND STUDIES: Urinalysis with specific gravity of 1.023, blood 1+, pH 5, 2 per high-power field RBC, 1 per high-power field WBC. WBC 10.3, hemoglobin 8.5, hematocrit 26.4, platelets 77, MCV 88, glucose 147, BUN 18, creatinine 1.2, sodium 143, potassium 3.6, chloride 109, carbon dioxide 26, calcium 8.8. LFTs within normal limits. INR 1.2. Troponin 0.15.   EKG with sinus rate of 77. There is a first degree AV block. No ST elevation or depression. No Q waves noted.  Chest x-ray without any infiltrate. There is prominent pulmonary vascularization but appears to be unchanged from previous x-ray. Official chest x-ray read is pending.   ASSESSMENT AND PLAN: Beth Bell is an 81 year old woman with history of hyperlipidemia, coronary artery disease, congestive heart failure, chronic edema, pulmonary hypertension, chronic obstructive pulmonary disease on chronic oxygen, hypertension, hypothyroidism,  pancytopenia, renal insufficiency presenting with altered mental status.  1. Altered mental status likely metabolic with sepsis, worsening right lower extremity cellulitis superior anterior leg with open wound and clear drainage. Will have Wound Care consultation evaluate. Will continue on clindamycin and start on gentle diuresis.  2. Elevated troponin likely with demand ischemia and CHF. Her BNP is slightly elevated from previous BNP. Continue to cycle cardiac enzymes. Continue on tele. Will hold off on diagnostics until seen by Cardiology. Follow I's and O's. Daily creatinine. Start on Lasix as above.  3. Pancytopenia, stable.  4. Chronic obstructive pulmonary disease on chronic oxygen, also stable. Resume her Advair, Singulair SVNs as needed, Spiriva.   TIME SPENT: Approximately 55 minutes spent on patient care.   ____________________________ Reuel Derby, MD ap:drc D: 02/16/2012 05:31:44 ET T: 02/16/2012 09:22:34 ET JOB#: [id number]  cc: Pearlean Brownie Veda Arrellano, MD, <Dictator> Lynnea Ferrier, MD Reuel Derby MD ELECTRONICALLY SIGNED 03/11/2012 0:12

## 2014-12-15 NOTE — Consult Note (Signed)
Brief Consult Note: Diagnosis: GI bleed. Chronic anemia.   Patient was seen by consultant.   Comments: Patient with chronic, recurrent anemia most likely secondary to AVM's seen in 2005. Presented with rectal bleeding. Hemodynamically stable. H and H stable and in fact better than her baseline.  Recommendations: Follow H and H and transfuse if hemoglobin falls below 8.0. Agree with rest of current management. Will follow.  Electronic Signatures: Lurline DelIftikhar, Zeke Aker (MD)  (Signed 13-Jul-13 16:09)  Authored: Brief Consult Note   Last Updated: 13-Jul-13 16:09 by Lurline DelIftikhar, Romayne Ticas (MD)

## 2014-12-15 NOTE — Consult Note (Signed)
Chief Complaint:   Subjective/Chief Complaint No BM and no bleeding. H and H has dropped down to 6.5. Platelet and WBC counts are low as well.   VITAL SIGNS/ANCILLARY NOTES: **Vital Signs.:   14-Jul-13 11:38   Vital Signs Type Routine   Temperature Temperature (F) 98.4   Celsius 36.8   Temperature Source oral   Pulse Pulse 67   Respirations Respirations 18   Systolic BP Systolic BP 832   Diastolic BP (mmHg) Diastolic BP (mmHg) 63   Mean BP 83   Pulse Ox % Pulse Ox % 100   Pulse Ox Activity Level  At rest   Oxygen Delivery 2L   Brief Assessment:   Additional Physical Exam Abdomen is soft and non tender. Patient is somewhat sleepy.   Lab Results: Routine Chem:  14-Jul-13 06:26    Glucose, Serum 72   BUN  20   Creatinine (comp) 0.94   Sodium, Serum  149   Potassium, Serum  2.9   Chloride, Serum  115   CO2, Serum 24   Calcium (Total), Serum  6.7   Anion Gap 10   Osmolality (calc) 297   eGFR (African American) >60   eGFR (Non-African American)  56 (eGFR values <6m/min/1.73 m2 may be an indication of chronic kidney disease (CKD). Calculated eGFR is useful in patients with stable renal function. The eGFR calculation will not be reliable in acutely ill patients when serum creatinine is changing rapidly. It is not useful in  patients on dialysis. The eGFR calculation may not be applicable to patients at the low and high extremes of body sizes, pregnant women, and vegetarians.)   Result Comment total calcium - RESULTS VERIFIED BY REPEAT TESTING.  - NOTIFIED OF CRITICAL VALUE  - nyo to [id number]  - READ-BACK PROCESS PERFORMED.  Result(s) reported on 05 Mar 2012 at 07:02AM.   Magnesium, Serum 1.9 (1.8-2.4 THERAPEUTIC RANGE: 4-7 mg/dL TOXIC: > 10 mg/dL  -----------------------)  Routine Hem:  14-Jul-13 06:26    WBC (CBC)  3.3   RBC (CBC)  2.36   Hemoglobin (CBC)  6.5   Hematocrit (CBC)  19.8   Platelet Count (CBC)  92   MCV 84   MCH 27.7   MCHC  32.9   RDW  17.7   Neutrophil % 66.4   Lymphocyte % 19.5   Monocyte % 11.6   Eosinophil % 2.1   Basophil % 0.4   Neutrophil # 2.2   Lymphocyte #  0.6   Monocyte # 0.4   Eosinophil # 0.1   Basophil # 0.0 (Result(s) reported on 05 Mar 2012 at 07:02AM.)   Assessment/Plan:  Assessment/Plan:   Assessment Chronic anemia along with thrombocytopenia and leucopenia. Rectal bleeding, resolved. History of coonis AVM's.    Plan Agree with another unit of PRBC transfusion. Follow H and H. Consider hematology consult. Clear liquids. Will follow.   Electronic Signatures: IJill Side(MD)  (Signed 14-Jul-13 12:53)  Authored: Chief Complaint, VITAL SIGNS/ANCILLARY NOTES, Brief Assessment, Lab Results, Assessment/Plan   Last Updated: 14-Jul-13 12:53 by IJill Side(MD)

## 2014-12-15 NOTE — Discharge Summary (Signed)
PATIENT NAME:  Beth Bell, Beth Bell MR#:  [id number] DATE OF BIRTH:  [birth year]  DATE OF ADMISSION:  02/16/2012 DATE OF DISCHARGE:  02/21/2012  FINAL DIAGNOSES:  1. Right lower extremity cellulitis.  2. Metabolic encephalopathy secondary to #1.  3. Chronic respiratory failure.  4. Chronic obstructive pulmonary disease.  5. Chronic right-sided systolic congestive heart failure.  6. Coronary artery disease.  7. Pancytopenia with likely underlying myelodysplastic syndrome, requiring transfusion.  8. Valvular heart disease with moderate to severe aortic valvular heart disease.   HISTORY AND PHYSICAL: Please see dictated admission history and physical.   [place]: The patient was admitted with confusion. She was found to have extensive erythema throughout the right lower extremity. She was placed on clindamycin as her antibiotic choices are limited due to her multiple medication allergies and intolerances. She is maintained on her home rate of oxygen and she did well with this. Her mental status improved over the next 24 to 48 hours as her cellulitis began to resolve. She has chronic right-sided systolic congestive heart failure, and she was watched closely for fluid overload. She chronically has leg edema and has been noncompliant with her TED hose. Medications were adjusted, and this did seem to improve.   She has history of pancytopenia with previous hematology evaluation suggesting myelodysplastic syndrome. Her hematocrit was below 7 and so she was transfused 1 unit of packed cells, with appropriate rise in hemoglobin. Hemoglobin level has been roughly around 8 and slightly higher, and will need to be followed.   It became clear that the patient would not be able to return home secondary to generalized weakness as well as difficulty caring for herself in general and so a bed search was sought for skilled nursing and for short-term rehabilitation. A bed became available and the patient  will be discharged to that facility in stable condition with her physical activity to be up with assistance as tolerated. She is hearing impaired and should be on fall precautions. She will need dressing changes to her right leg skin tear daily as needed with Tubigrip light compression stockings size E knee-high to the patient's lower extremities bilaterally. Her diet should be no added salt. She will need a MET-B, CBC in one week with results to the nursing home physician. Physical therapy and occupational therapy should evaluate and treat the patient.   We recommend that she be weighed daily with physician called for more than 2 pound gain in one day or 5 pounds in one week or increasing signs or symptoms of heart failure. She has had previous echocardiogram performed, revealed preserved LV function. Echocardiogram here again confirmed this. She is not a beta blocker candidate secondary to her severe chronic obstructive pulmonary disease. She does not tolerate ACE inhibitors or ARBs.   DISCHARGE MEDICATIONS:  1. Advair 250/50 one puff twice a day. 2. Albuterol metered-dose inhaler 2 puffs every 4 hours p.r.n. wheezing.  3. Potassium 10 mEq p.o. daily.  4. Singulair 10 mg p.o. at bedtime.  5. Spiriva 1 capsule inhaled daily.  6. Sertraline 50 mg p.o. daily for anxiety and depression.  7. Lipitor 10 mg p.o. at bedtime. 8. Omeprazole 40 mg p.o. daily for gastroesophageal reflux disease.  9. Lasix 40 mg p.o. daily.  10. Clindamycin 300 mg p.o. three times daily x10 days.  11. Vitamin D 2000 units p.o. daily for vitamin D deficiency.  12. Aspirin 81 mg p.o. daily.   CODE STATUS: The patient is FULL CODE. It  is recommended that this be readdressed with her family members given this patient's multiple concurrent illnesses and recurrent hospitalizations.  ____________________________ Adin Hector, MD bjk:slb D: 02/21/2012 08:04:26 ET     T: 02/21/2012 08:26:08 ET       JOB#: [id number] cc: Adin Hector, MD, <Dictator> Ramonita Lab MD ELECTRONICALLY SIGNED 02/28/2012 13:22
